# Patient Record
Sex: Female | Born: 1990
Health system: Southern US, Community
[De-identification: ages and names within clinical notes are randomized; demographics above are authoritative.]

## PROBLEM LIST (undated history)

## (undated) DIAGNOSIS — N760 Acute vaginitis: Secondary | ICD-10-CM

## (undated) DIAGNOSIS — T7840XA Allergy, unspecified, initial encounter: Secondary | ICD-10-CM

## (undated) DIAGNOSIS — N946 Dysmenorrhea, unspecified: Secondary | ICD-10-CM

## (undated) DIAGNOSIS — B3731 Acute candidiasis of vulva and vagina: Secondary | ICD-10-CM

## (undated) DIAGNOSIS — B373 Candidiasis of vulva and vagina: Secondary | ICD-10-CM

## (undated) DIAGNOSIS — M419 Scoliosis, unspecified: Secondary | ICD-10-CM

## (undated) DIAGNOSIS — B9689 Other specified bacterial agents as the cause of diseases classified elsewhere: Secondary | ICD-10-CM

## (undated) DIAGNOSIS — E785 Hyperlipidemia, unspecified: Secondary | ICD-10-CM

## (undated) DIAGNOSIS — M549 Dorsalgia, unspecified: Secondary | ICD-10-CM

## (undated) HISTORY — DX: Allergy, unspecified, initial encounter: T78.40XA

## (undated) HISTORY — DX: Dysmenorrhea, unspecified: N94.6

## (undated) HISTORY — DX: Hyperlipidemia, unspecified: E78.5

## (undated) HISTORY — DX: Dorsalgia, unspecified: M54.9

## (undated) HISTORY — PX: WISDOM TOOTH EXTRACTION: SHX21

---
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
## 2009-08-27 ENCOUNTER — Encounter (INDEPENDENT_AMBULATORY_CARE_PROVIDER_SITE_OTHER): Payer: Self-pay | Admitting: Family Medicine

## 2009-08-27 ENCOUNTER — Ambulatory Visit: Payer: Self-pay | Admitting: Internal Medicine

## 2009-08-27 LAB — CONVERTED CEMR LAB
ALT: 8 units/L (ref 0–35)
AST: 19 units/L (ref 0–37)
CO2: 24 meq/L (ref 19–32)
Calcium: 9.7 mg/dL (ref 8.4–10.5)
Chloride: 105 meq/L (ref 96–112)
Lymphs Abs: 2.2 10*3/uL (ref 0.7–4.0)
Monocytes Relative: 7 % (ref 3–12)
Neutro Abs: 4.3 10*3/uL (ref 1.7–7.7)
Neutrophils Relative %: 61 % (ref 43–77)
Platelets: 226 10*3/uL (ref 150–400)
RBC: 4.3 M/uL (ref 3.87–5.11)
Sodium: 141 meq/L (ref 135–145)
Total Bilirubin: 0.5 mg/dL (ref 0.3–1.2)
Total Protein: 7.8 g/dL (ref 6.0–8.3)
Vit D, 25-Hydroxy: <10 ng/mL — ABNORMAL LOW (ref 30–89)
WBC: 7.1 10*3/uL (ref 4.0–10.5)

## 2009-10-07 ENCOUNTER — Ambulatory Visit: Payer: Self-pay | Admitting: Internal Medicine

## 2009-10-07 ENCOUNTER — Encounter (INDEPENDENT_AMBULATORY_CARE_PROVIDER_SITE_OTHER): Payer: Self-pay | Admitting: Family Medicine

## 2009-10-07 LAB — CONVERTED CEMR LAB: Chlamydia, DNA Probe: NEGATIVE

## 2010-08-17 ENCOUNTER — Other Ambulatory Visit (HOSPITAL_COMMUNITY): Payer: Self-pay | Admitting: Family Medicine

## 2010-08-17 DIAGNOSIS — N946 Dysmenorrhea, unspecified: Secondary | ICD-10-CM

## 2010-08-19 ENCOUNTER — Ambulatory Visit (HOSPITAL_COMMUNITY)
Admission: RE | Admit: 2010-08-19 | Discharge: 2010-08-19 | Disposition: A | Discharge status: Home / Self Care | Payer: Self-pay | Source: Ambulatory Visit | Attending: Family Medicine | Admitting: Family Medicine

## 2010-08-19 DIAGNOSIS — N946 Dysmenorrhea, unspecified: Secondary | ICD-10-CM

## 2010-10-28 ENCOUNTER — Encounter: Payer: Self-pay | Admitting: Obstetrics and Gynecology

## 2011-02-15 LAB — RAPID STREP SCREEN (MED CTR MEBANE ONLY): Streptococcus, Group A Screen (Direct): NEGATIVE

## 2011-08-26 ENCOUNTER — Emergency Department (INDEPENDENT_AMBULATORY_CARE_PROVIDER_SITE_OTHER)
Admission: EM | Admit: 2011-08-26 | Discharge: 2011-08-26 | Disposition: A | Discharge status: Home / Self Care | Payer: Self-pay | Source: Home / Self Care | Attending: Emergency Medicine | Admitting: Emergency Medicine

## 2011-08-26 ENCOUNTER — Encounter (HOSPITAL_COMMUNITY): Payer: Self-pay | Admitting: Emergency Medicine

## 2011-08-26 DIAGNOSIS — N760 Acute vaginitis: Secondary | ICD-10-CM

## 2011-08-26 HISTORY — DX: Acute vaginitis: N76.0

## 2011-08-26 HISTORY — DX: Candidiasis of vulva and vagina: B37.3

## 2011-08-26 HISTORY — DX: Acute candidiasis of vulva and vagina: B37.31

## 2011-08-26 HISTORY — DX: Other specified bacterial agents as the cause of diseases classified elsewhere: B96.89

## 2011-08-26 LAB — POCT PREGNANCY, URINE: Preg Test, Ur: NEGATIVE

## 2011-08-26 LAB — POCT URINALYSIS DIP (DEVICE)
Glucose, UA: NEGATIVE mg/dL
Ketones, ur: NEGATIVE mg/dL
Protein, ur: NEGATIVE mg/dL

## 2011-08-26 LAB — WET PREP, GENITAL

## 2011-08-26 MED ORDER — LIDOCAINE HCL (PF) 1 % IJ SOLN
INTRAMUSCULAR | Status: AC
  Filled 2011-08-26: qty 5

## 2011-08-26 MED ORDER — CEFTRIAXONE SODIUM 250 MG IJ SOLR
INTRAMUSCULAR | Status: AC
  Filled 2011-08-26: qty 250

## 2011-08-26 MED ORDER — CEFTRIAXONE SODIUM 250 MG IJ SOLR
250.0000 mg | Freq: Once | INTRAMUSCULAR | Status: AC
  Administered 2011-08-26: 250 mg via INTRAMUSCULAR

## 2011-08-26 MED ORDER — METRONIDAZOLE 500 MG PO TABS: 500.0000 mg | ORAL_TABLET | Freq: Two times a day (BID) | ORAL | Status: AC

## 2011-08-26 MED ORDER — AZITHROMYCIN 250 MG PO TABS
ORAL_TABLET | ORAL | Status: AC
  Filled 2011-08-26: qty 4

## 2011-08-26 MED ORDER — AZITHROMYCIN 250 MG PO TABS
1000.0000 mg | ORAL_TABLET | Freq: Once | ORAL | Status: AC
  Administered 2011-08-26: 1000 mg via ORAL

## 2011-08-26 NOTE — Discharge Instructions (Signed)
Take the medication as written. Give [place] a working phone number so that we can contact you if needed. Refrain from sexual contact until you know your results and your partner(s) are treated. Return if you get worse, have a fever >100.4, or for any concerns.   Go to www.goodrx.com to look up your medications. This will give you a list of where you can find your prescriptions at the most affordable prices.   Go to www.goodrx.com to look up your medications. This will give you a list of where you can find your prescriptions at the most affordable prices.   RESOURCE GUIDE  Chronic Pain Problems Contact [facility]  [phone] Patients need to be referred by their primary care doctor.  Insufficient Money for Medicine Contact [employer]:  call "211" or [facility] [phone].  No Primary Care Doctor Call [facility]  [phone] Other agencies that provide inexpensive medical care    [facility]  [phone]    [facility]  [phone]    [facility]  [phone]    [facility]  [phone] [address] [place] [address]    [facility]  [phone]    [facility]  [phone] [facility] [phone]   [address] [place] [address] [place], [place] [address]  [facility] Resources  [facility]     [employer]                          [facility]. [address] [place]                       [address]      [address] [place] [address]   [phone] (After Hours)

## 2011-08-26 NOTE — ED Provider Notes (Signed)
History     CSN: [id number]  Arrival date & time 08/26/11  1129   First MD Initiated Contact with Patient 08/26/11 1131      Chief Complaint  Patient presents with  . Vaginitis    (Consider location/radiation/quality/duration/timing/severity/associated sxs/prior treatment) HPI Comments: Pt with 3 days oderous brownish vaginal discharge,  No urgency, frequency, dysuria, oderous urine, hematuria,  genital blisters, vaginal itching. No fevers, N/V, abd pain, back pain. No recent abx use. Pt sexually active with a female partner who is  asxatic. does not use condoms. STD's  a concern today. Similar sx before when had BV. States she always gets BV which she has intercourse with her partner. Denies any other sexual contacts. Also has history of yeast infections. No history of gonorrhea chlamydia trichmonoas.  No h/o syphilis, herpes, HIV.  ROS as noted in HPI. All other ROS negative.   Patient is a 21 y.o. female presenting with vaginal discharge.  Vaginal Discharge This is a new problem. The current episode started more than 2 days ago. The problem occurs constantly. The problem has not changed since onset.Pertinent negatives include no abdominal pain. The symptoms are aggravated by nothing. The symptoms are relieved by nothing. She has tried nothing for the symptoms. The treatment provided no relief.    Past Medical History  Diagnosis Date  . BV (bacterial vaginosis)   . Yeast vaginitis     History reviewed. No pertinent past surgical history.  History reviewed. No pertinent family history.  History  Substance Use Topics  . Smoking status: Never Smoker   . Smokeless tobacco: Not on file  . Alcohol Use: Yes    OB History    Grav Para Term Preterm Abortions TAB SAB Ect Mult Living                  Review of Systems  Gastrointestinal: Negative for abdominal pain.  Genitourinary: Positive for vaginal discharge.    Allergies  Review of patient's allergies indicates no known  allergies.  Home Medications   Current Outpatient Rx  Name Route Sig Dispense Refill  . METRONIDAZOLE 500 MG PO TABS Oral Take 1 tablet (500 mg total) by mouth 2 (two) times daily. X 7 days 14 tablet 0    BP 128/82  Pulse 78  Temp(Src) 98.7 F (37.1 C) (Oral)  Resp 18  SpO2 100%  LMP 08/10/2011  Physical Exam  Nursing note and vitals reviewed. Constitutional: She is oriented to person, place, and time. She appears well-developed and well-nourished. No distress.  HENT:  Head: Normocephalic and atraumatic.  Eyes: EOM are normal.  Neck: Normal range of motion.  Cardiovascular: Normal rate.   Pulmonary/Chest: Effort normal.  Abdominal: Soft. Bowel sounds are normal. She exhibits no distension. There is no tenderness. There is no rebound and no guarding.  Genitourinary: Uterus normal. Pelvic exam was performed with patient supine. There is no rash on the right labia. There is no rash on the left labia. Uterus is not tender. Cervix exhibits no motion tenderness and no friability. Right adnexum displays no mass, no tenderness and no fullness. Left adnexum displays no mass, no tenderness and no fullness. No erythema, tenderness or bleeding around the vagina. No foreign body around the vagina. Vaginal discharge found.       Thin white oderous  vaginal d/c.Chaperone present during exam  Musculoskeletal: Normal range of motion.  Neurological: She is alert and oriented to person, place, and time.  Skin: Skin is warm and dry.  Psychiatric: She has a normal mood and affect. Her behavior is normal. Judgment and thought content normal.    ED Course  Procedures (including critical care time)  Labs Reviewed  POCT URINALYSIS DIP (DEVICE) - Abnormal; Notable for the following:    Leukocytes, UA TRACE (*) Biochemical Testing Only. Please order routine urinalysis from main lab if confirmatory testing is needed.   All other components within normal limits  WET PREP, GENITAL - Abnormal; Notable for  the following:    Yeast Wet Prep HPF POC FEW (*)    Clue Cells Wet Prep HPF POC MANY (*)    WBC, Wet Prep HPF POC MODERATE (*)    All other components within normal limits  POCT PREGNANCY, URINE  GC/CHLAMYDIA PROBE AMP, GENITAL   No results found.   1. Vaginitis       MDM  Previous records reviewed Patient tested negative for gonorrhea Chlamydia 2011. Pelvic ultrasound 2012 was normal  H&P most c/w  BV. Sent off GC/chlamydia, wet prep. Will  treat empirically now per patient request. Giving ceftriaxone 250 mg IM/azithro 1 gm po. Will send home with flagyl. Advised pt to refrain from sexual contact until she he knows lab results, symptoms resolve, and partner(s) are treated. Pt provided working phone number. Pt agrees.     Luiz Blare, MD 08/26/11 1256

## 2011-08-26 NOTE — ED Notes (Signed)
PT HERE WITH C/O BROWNISH VAG D/C WITH ODOR,ITCH AFTER HAVING UNPROTECTED SEX LAST WEEK.PT STATES SX SHOWED UP X 3 DYS AGO.DENIES FEVER,CHILLS,N,V.LMP 08/10/11

## 2011-08-27 NOTE — ED Notes (Signed)
Wet Prep:  Many Clue Cells and Moderate WBCs.  Pt adequately treated with Flaqyl.

## 2011-08-31 ENCOUNTER — Ambulatory Visit (INDEPENDENT_AMBULATORY_CARE_PROVIDER_SITE_OTHER): Payer: 59 | Admitting: Family Medicine

## 2011-08-31 ENCOUNTER — Encounter: Payer: Self-pay | Admitting: Family Medicine

## 2011-08-31 VITALS — BP 131/79 | HR 82 | Ht 66.25 in | Wt 146.0 lb

## 2011-08-31 DIAGNOSIS — B3731 Acute candidiasis of vulva and vagina: Secondary | ICD-10-CM

## 2011-08-31 DIAGNOSIS — N946 Dysmenorrhea, unspecified: Secondary | ICD-10-CM

## 2011-08-31 DIAGNOSIS — B373 Candidiasis of vulva and vagina: Secondary | ICD-10-CM

## 2011-08-31 DIAGNOSIS — N944 Primary dysmenorrhea: Secondary | ICD-10-CM

## 2011-08-31 DIAGNOSIS — Z13220 Encounter for screening for lipoid disorders: Secondary | ICD-10-CM

## 2011-08-31 MED ORDER — FLUCONAZOLE 150 MG PO TABS: 150.0000 mg | ORAL_TABLET | Freq: Once | ORAL | Status: AC

## 2011-08-31 MED ORDER — NORGESTIMATE-ETH ESTRADIOL 0.25-35 MG-MCG PO TABS: 1.0000 | ORAL_TABLET | Freq: Every day | ORAL | Status: DC

## 2011-08-31 NOTE — Patient Instructions (Signed)
I sent in a prescription for both the birth control pill and the fluconazole for the yeast infection.   I would also like to get some lab work to check your cholesterol. You can come back for a lab visit. Please be fasting after midnight that day.   I would like to see you back after your next period to see if the birth control helped.

## 2011-09-01 ENCOUNTER — Encounter: Payer: Self-pay | Admitting: Family Medicine

## 2011-09-01 DIAGNOSIS — N944 Primary dysmenorrhea: Secondary | ICD-10-CM | POA: Insufficient documentation

## 2011-09-01 DIAGNOSIS — B373 Candidiasis of vulva and vagina: Secondary | ICD-10-CM | POA: Insufficient documentation

## 2011-09-01 DIAGNOSIS — B3731 Acute candidiasis of vulva and vagina: Secondary | ICD-10-CM | POA: Insufficient documentation

## 2011-09-01 NOTE — Assessment & Plan Note (Signed)
Ongoing problem since adolescence. Patient had [place] in 2011 that did not show any acute abnormality.  Since patient had a pelvic exam at Urgent Care a couple of days ago, I did not perform one for this visit.  Patient agreable to try OCP again. She mentioned that they had helped when she had taken them but that she was trouble remembering to take them. She is now in a more stable place in her life and thinks she will be able to take them regularly. Also recommended that she take ibuprofen before onset of period, although she had tried doing this before and had not seen much benefit.  Start monophasic sprintec.  Will follow up in 1-2 months, after patient has next period to assess for any improvement. Patient will also need paperwork in June for [facility].

## 2011-09-01 NOTE — Progress Notes (Signed)
Patient ID: Amy Barrett, female   DOB: [birth year], 21 y.o.   MRN: [id number]  Chief Complaint  Patient presents with  . Dysmenorrhea    HPI Amy Barrett is a 21 y.o. female who presents to establish care and to be seen for dysmenorrhea and to get process started for [facility]. - Dysmenorrhea: has been present since adolescence. She has been seen by gyn in 2011 and had an ultrasounds that was normal. She was told that it could potentially be endometriosis and that the only way to know would be laparoscopy. Symptoms include cramping pain, associated with nausea and inability to take much by mouth. This starts on the first day of period and lasts one day. The following day she is tired but doesn't experience pain. Cramps on that day are debilitating to the point of her missing work, which is why she would like to apply for FMLA. She has been on ibuprofen 800mg  before but has difficulty with it because she cannot eat when the cramping occurs and she is afraid to hurt her stomach. She was given vicodin which did not help. She has been on OCP before but had trouble remembering to take them. She mentions that she would be willing to try now since her schedule is now a little more predictable than it was in the past.  Denies vaginal discharge, odor, burning. Denies constipation or diarrhea. Denies dysuria or hematuria.   - follow up from urgent care on 08/26/11: had wet prep that showed BV and fluconazole. Rx for flagyl was sent. Patient has not yet filled it. Negative GC/Chl.  Denies any current vaginal discharge, burning.   Gyn History: Menarche: 21 yo Regular periods, every 21-28 days, lasts 5 days. Dysmenorrhea on day 1. Has had dysmenorrhea since middle school.  HPI  Past Medical History  Diagnosis Date  . BV (bacterial vaginosis)   . Yeast vaginitis   . Dysmenorrhea since adolescence    [place] in 2011 that did not show any acute findings. Seen by Dr. Vickey Sages    History reviewed. No pertinent past  surgical history.  Family History  Problem Relation Age of Onset  . Hypertension Mother   . Breast cancer Maternal Grandmother 50    Social History History  Substance Use Topics  . Smoking status: Never Smoker   . Smokeless tobacco: Not on file  . Alcohol Use: Yes     occasional, social    No Known Allergies  Current Outpatient Prescriptions  Medication Sig Dispense Refill  . fluconazole (DIFLUCAN) 150 MG tablet Take 1 tablet (150 mg total) by mouth once.  1 tablet  0  . metroNIDAZOLE (FLAGYL) 500 MG tablet Take 1 tablet (500 mg total) by mouth 2 (two) times daily. X 7 days  14 tablet  0  . norgestimate-ethinyl estradiol (ORTHO-CYCLEN,SPRINTEC,PREVIFEM) 0.25-35 MG-MCG tablet Take 1 tablet by mouth daily.  1 Package  11     Review of Systems See HPI Blood pressure 131/79, pulse 82, height 5' 6.25" (1.683 m), weight 146 lb (66.225 kg), last menstrual period 08/10/2011.  Physical Exam Physical Exam  Constitutional: She is oriented to person, place, and time. She appears well-developed. No distress.  HENT:  Mouth/Throat: Oropharynx is clear and moist.  Eyes: Conjunctivae and EOM are normal. Pupils are equal, round, and reactive to light.  Neck: Normal range of motion. Neck supple. No thyromegaly present.  Cardiovascular: Normal rate, regular rhythm and normal heart sounds.   No murmur heard. Pulmonary/Chest: Effort normal and breath sounds  normal.  Abdominal: Soft. Bowel sounds are normal. She exhibits no distension. There is no tenderness. There is no rebound and no guarding.  Musculoskeletal: Normal range of motion. She exhibits no edema.  Neurological: She is alert and oriented to person, place, and time.  Skin: Skin is warm and dry.    Assessment    See problem list    Plan    See problem list       Marena Chancy 09/01/2011, 5:18 PM

## 2011-09-01 NOTE — Assessment & Plan Note (Signed)
Sent Rx for fluconazole 150mg  po once

## 2011-10-14 ENCOUNTER — Other Ambulatory Visit (HOSPITAL_COMMUNITY)
Admission: RE | Admit: 2011-10-14 | Discharge: 2011-10-14 | Disposition: A | Discharge status: Home / Self Care | Payer: 59 | Source: Ambulatory Visit | Attending: Family Medicine | Admitting: Family Medicine

## 2011-10-14 ENCOUNTER — Telehealth: Payer: Self-pay | Admitting: Family Medicine

## 2011-10-14 ENCOUNTER — Ambulatory Visit (INDEPENDENT_AMBULATORY_CARE_PROVIDER_SITE_OTHER): Payer: 59 | Admitting: Family Medicine

## 2011-10-14 ENCOUNTER — Encounter: Payer: Self-pay | Admitting: Family Medicine

## 2011-10-14 VITALS — BP 109/73 | HR 92 | Temp 98.5°F | Ht 66.25 in | Wt 144.0 lb

## 2011-10-14 DIAGNOSIS — Z113 Encounter for screening for infections with a predominantly sexual mode of transmission: Secondary | ICD-10-CM | POA: Insufficient documentation

## 2011-10-14 DIAGNOSIS — N898 Other specified noninflammatory disorders of vagina: Secondary | ICD-10-CM

## 2011-10-14 DIAGNOSIS — J039 Acute tonsillitis, unspecified: Secondary | ICD-10-CM | POA: Insufficient documentation

## 2011-10-14 DIAGNOSIS — Z7251 High risk heterosexual behavior: Secondary | ICD-10-CM | POA: Insufficient documentation

## 2011-10-14 DIAGNOSIS — J029 Acute pharyngitis, unspecified: Secondary | ICD-10-CM

## 2011-10-14 DIAGNOSIS — N760 Acute vaginitis: Secondary | ICD-10-CM | POA: Insufficient documentation

## 2011-10-14 LAB — POCT WET PREP (WET MOUNT)

## 2011-10-14 LAB — POCT RAPID STREP A (OFFICE): Rapid Strep A Screen: NEGATIVE

## 2011-10-14 MED ORDER — PENICILLIN G BENZATHINE 1200000 UNIT/2ML IM SUSP
1.2000 10*6.[IU] | Freq: Once | INTRAMUSCULAR | Status: AC
  Administered 2011-10-14: 1.2 10*6.[IU] via INTRAMUSCULAR

## 2011-10-14 MED ORDER — METRONIDAZOLE 500 MG PO TABS: 500.0000 mg | ORAL_TABLET | Freq: Two times a day (BID) | ORAL | Status: AC

## 2011-10-14 NOTE — Assessment & Plan Note (Addendum)
A: exam consistent with tonsillitis with bilateral exudate. Possible strep pharyngitis but negative swab. Patient has modified Centor score of 3.  P: treat with IM bicillin.

## 2011-10-14 NOTE — Progress Notes (Addendum)
Subjective:     Patient ID: Prudencio Burly, female   DOB: 01/18/1991, 21 y.o.   MRN: [id number]  HPI 21 yo F presents for same day visit to discuss the following:  1. Sore throat x 2 days. No cough, fever, chest pain, headache or sick contacts. Has not treated the sore throat.   2. Vaginal discharge x 1 week. Sexually active with ex-boyfriend. Does not use condoms or OCPs. LMP 10/02/11. Ex-BF has other sexual partners. Discharge is malodorous. She denies abdominal or pelvic pain.   Reviewed history: no cigarette or drug use.   Review of Systems As per HPI    Objective:   Physical Exam  Constitutional: She appears well-developed and well-nourished. No distress.  HENT:  Mouth/Throat: Uvula is midline and mucous membranes are normal. Oropharyngeal exudate present. No posterior oropharyngeal edema, posterior oropharyngeal erythema or tonsillar abscesses.    Neck:    Genitourinary: Uterus normal. Pelvic exam was performed with patient prone. Cervix exhibits discharge. Cervix exhibits no motion tenderness and no friability. Right adnexum displays no mass, no tenderness and no fullness. Left adnexum displays no mass, no tenderness and no fullness. No erythema, tenderness or bleeding around the vagina. No foreign body around the vagina. No signs of injury around the vagina. Vaginal discharge found.   Assessment and Plan:

## 2011-10-14 NOTE — Telephone Encounter (Signed)
Called pt. Left VM. Wet prep positive for clue cell (BV) will treat with flagyl. 1 tab BID x 7 days.

## 2011-10-14 NOTE — Assessment & Plan Note (Signed)
A: suspect BV given odor and consistency of discharge. P: -wet prep -GC/Chlam.

## 2011-10-14 NOTE — Patient Instructions (Addendum)
Amy Barrett,  Thank you for coming in today.  For your throat: we will treat with a shot of PCN. Take tylenol, use lozenges and gargle with warm salt water as needed for pain. Call if you develop, neck swelling or worsening pain.   For your discharge: most likely BV, I will also check for gonorrhea, chlamydia and HIV.  Dr. Armen Pickup

## 2011-10-14 NOTE — Assessment & Plan Note (Signed)
Couseled patient on safe sex. Gave condoms. Will screen for HIV.

## 2011-10-18 ENCOUNTER — Telehealth: Payer: Self-pay | Admitting: Family Medicine

## 2011-10-18 NOTE — Telephone Encounter (Signed)
Called patient at home. Informed her that GC/Chlam were negative. Her wet prep was positive for clue cells only.

## 2011-10-25 ENCOUNTER — Telehealth: Payer: Self-pay | Admitting: Family Medicine

## 2011-10-25 NOTE — Telephone Encounter (Signed)
Patient is calling to check on the status of her FMLA paperwork.  She needs to turn it in by 6/28. Also, she has finished the meds for her bacterial infection but is still having symptoms and wants to know if she needs to take something else.

## 2011-10-25 NOTE — Telephone Encounter (Signed)
Called and left message letting patient know that form will be available to pick up at front desk tomorrow afternoon.

## 2011-10-26 ENCOUNTER — Telehealth: Payer: Self-pay | Admitting: Family Medicine

## 2011-10-26 NOTE — Telephone Encounter (Signed)
Called and left message. Let patient know that if the flagyl treatment did not work for her BV infection, we would probably need to test her again to make sure that BV is still present after treatment. If it is, would start metrogel for 7-10 days with maintenance treatment twice weekly for 4-6 months, for recurrent infection. Recommended that she make appointment to get test for confirmation of failure of treatment.

## 2011-10-26 NOTE — Telephone Encounter (Signed)
Called patient back.  No answer.  Will call patient back later today.  Gaylene Brooks, RN

## 2011-10-26 NOTE — Telephone Encounter (Signed)
Is needing an answer for the 2nd part of her message.

## 2011-10-27 NOTE — Telephone Encounter (Signed)
Called patient and no answer.  Will call patient back later today.  Gaylene Brooks, RN

## 2011-10-28 NOTE — Telephone Encounter (Signed)
Called patient and no answer.  Will await call back from patient.  Gaylene Brooks, RN

## 2011-10-29 ENCOUNTER — Ambulatory Visit (INDEPENDENT_AMBULATORY_CARE_PROVIDER_SITE_OTHER): Payer: 59 | Admitting: Family Medicine

## 2011-10-29 ENCOUNTER — Encounter: Payer: Self-pay | Admitting: Family Medicine

## 2011-10-29 VITALS — BP 106/60 | HR 76 | Temp 98.6°F | Ht 66.25 in | Wt 139.2 lb

## 2011-10-29 DIAGNOSIS — N760 Acute vaginitis: Secondary | ICD-10-CM

## 2011-10-29 LAB — POCT WET PREP (WET MOUNT): Clue Cells Wet Prep Whiff POC: NEGATIVE

## 2011-10-29 MED ORDER — FLUCONAZOLE 150 MG PO TABS: 150.0000 mg | ORAL_TABLET | Freq: Once | ORAL | Status: AC

## 2011-10-29 NOTE — Progress Notes (Signed)
  Subjective:    Patient ID: Amy Barrett, female    DOB: [birth year], 21 y.o.   MRN: [id number]  HPI Follow-up: vaginal discharge  She was seen on 06/13 and diagnosed with BV. She reports the smell has gone away but it remains persistently irritated. GC/Chlamydia negative at that time. She finished a course of metronidazole. She is about due for her next period. She denies intercourse for the past month (since onset of symptoms).   Review of Systems Per HPI.  Denies dysuria/frequency/urgency.     Objective:   Physical Exam GEN: NAD; well-nourished, well-appearing PSYCH: engaged, appropriate, normal affect, alert and oriented GU:    Vulva: normal without lesions or tenderness   Vagina: thick white vaginal discharge mainly in posterior vault   Cervix: pink, non-friable, normal-appearing  Wet prep performed    Assessment & Plan:

## 2011-10-29 NOTE — Patient Instructions (Addendum)
-  I will call you with the results

## 2011-10-29 NOTE — Assessment & Plan Note (Addendum)
Will repeat wet prep today.>>>No yeast but lots of white cells. Discharge consistent with yeast and patient just finished antibiotic course of BV. Will send in Rx for Diflucan. Called and informed pt.

## 2011-12-28 ENCOUNTER — Ambulatory Visit (INDEPENDENT_AMBULATORY_CARE_PROVIDER_SITE_OTHER): Payer: 59 | Admitting: Family Medicine

## 2011-12-28 ENCOUNTER — Encounter: Payer: Self-pay | Admitting: Family Medicine

## 2011-12-28 VITALS — BP 118/77 | HR 58 | Temp 98.6°F | Ht 66.0 in | Wt 140.0 lb

## 2011-12-28 DIAGNOSIS — N760 Acute vaginitis: Secondary | ICD-10-CM

## 2011-12-28 DIAGNOSIS — N898 Other specified noninflammatory disorders of vagina: Secondary | ICD-10-CM

## 2011-12-28 DIAGNOSIS — Z7251 High risk heterosexual behavior: Secondary | ICD-10-CM

## 2011-12-28 LAB — POCT WET PREP (WET MOUNT)

## 2011-12-28 NOTE — Assessment & Plan Note (Addendum)
A: negative wet prep. Suspect skin irritation.  P:  Called patient with results. Negative wet prep.  Advised sitz baths. Advised f/u as needed for persistent or new GU symptoms.

## 2011-12-28 NOTE — Progress Notes (Signed)
Patient ID: Amy Barrett, female   DOB: 04-13-1991, 21 y.o.   MRN: [id number] Subjective:     Amy Barrett is a 21 y.o. female who presents for evaluation of an abnormal vaginal discharge. Symptoms have been present for 2 weeks. Vaginal symptoms: vulvar itching. Contraception: none. She denies abnormal bleeding, blisters, bumps, odor, pain and warts Sexually transmitted infection risk: possible STD exposure in the past. Not sexually active now.  Menstrual flow: regular every 28-30 days.  The following portions of the patient's history were reviewed and updated as appropriate: allergies, current medications, past family history, past medical history, past social history, past surgical history and problem list.  Review of Systems Pertinent items are noted in HPI.    Objective:    BP 118/77  Pulse 58  Temp 98.6 F (37 C) (Oral)  Ht 5\' 6"  (1.676 m)  Wt 140 lb (63.504 kg)  BMI 22.60 kg/m2 General appearance: alert, cooperative and no distress Abdomen: soft, non-tender; bowel sounds normal; no masses,  no organomegaly Pelvic: cervix normal in appearance, external genitalia normal, no adnexal masses or tenderness, no cervical motion tenderness and positive findings: vaginal discharge:  copious, white, thick and odorless    Assessment and Plan:

## 2011-12-28 NOTE — Assessment & Plan Note (Signed)
History of unprotected sex. Last HIV test negative in 2008. Will recheck HIV today. No signs or symptoms to suggest HSV, HPV or syphilis.

## 2011-12-28 NOTE — Patient Instructions (Addendum)
Ahlaya,  Thank you for coming in today. I will call with wet prep and HIV test results.   [facility] Phone:  [phone] Location: [address] Hours: 7:30 a.m. to 7:30 p.m., Monday - Friday  Dr. Armen Pickup

## 2011-12-29 LAB — HIV ANTIBODY (ROUTINE TESTING W REFLEX): HIV: NONREACTIVE

## 2011-12-30 ENCOUNTER — Encounter: Payer: Self-pay | Admitting: *Deleted

## 2012-01-12 ENCOUNTER — Telehealth: Payer: Self-pay | Admitting: Family Medicine

## 2012-01-12 ENCOUNTER — Encounter: Payer: Self-pay | Admitting: Family Medicine

## 2012-01-12 ENCOUNTER — Ambulatory Visit (INDEPENDENT_AMBULATORY_CARE_PROVIDER_SITE_OTHER): Payer: 59 | Admitting: Family Medicine

## 2012-01-12 ENCOUNTER — Other Ambulatory Visit (HOSPITAL_COMMUNITY)
Admission: RE | Admit: 2012-01-12 | Discharge: 2012-01-12 | Disposition: A | Discharge status: Home / Self Care | Payer: 59 | Source: Ambulatory Visit | Attending: Family Medicine | Admitting: Family Medicine

## 2012-01-12 VITALS — BP 110/75 | HR 65 | Ht 66.25 in | Wt 138.0 lb

## 2012-01-12 DIAGNOSIS — B9689 Other specified bacterial agents as the cause of diseases classified elsewhere: Secondary | ICD-10-CM | POA: Insufficient documentation

## 2012-01-12 DIAGNOSIS — N760 Acute vaginitis: Secondary | ICD-10-CM

## 2012-01-12 DIAGNOSIS — A499 Bacterial infection, unspecified: Secondary | ICD-10-CM

## 2012-01-12 DIAGNOSIS — N898 Other specified noninflammatory disorders of vagina: Secondary | ICD-10-CM

## 2012-01-12 DIAGNOSIS — Z113 Encounter for screening for infections with a predominantly sexual mode of transmission: Secondary | ICD-10-CM | POA: Insufficient documentation

## 2012-01-12 LAB — POCT WET PREP (WET MOUNT): WBC, Wet Prep HPF POC: >20

## 2012-01-12 MED ORDER — FLUCONAZOLE 150 MG PO TABS: 150.0000 mg | ORAL_TABLET | Freq: Once | ORAL | Status: AC

## 2012-01-12 MED ORDER — METRONIDAZOLE 500 MG PO TABS: 500.0000 mg | ORAL_TABLET | Freq: Two times a day (BID) | ORAL | Status: DC

## 2012-01-12 NOTE — Progress Notes (Signed)
  Subjective:    Patient ID: Amy Barrett, female    DOB: 09/09/1990, 21 y.o.   MRN: [id number]  HPI  72 year F who presents with vaginal odor and discharge. This problem started 2 weeks ago, and the patient was evaluated by Dr. Armen Pickup. At that time, the patient was only experiencing vaginal discharge. The wet prep did not demonstrate any clue cells or evidence of trichomoniasis or candidiasis. She was last sexually active 2 weeks ago. It is a part of the she's had multiple times, and she often gets bacterial vaginosis after intercourse with him. She had unprotected sex. She denies a history of gonorrhea or Chlamydia and recently tested negative for HIV.  Review of Systems  She currently denies any pelvic pain dysuria, dyspareunia, vaginal bleeding, fevers, chills, nausea and vomiting, or lymphadenopathy.     Objective:   Physical Exam  Constitutional: She appears well-developed and well-nourished. No distress.  Cardiovascular: Normal rate and regular rhythm.   Pulmonary/Chest: Effort normal and breath sounds normal.  Abdominal: Soft. Bowel sounds are normal. There is tenderness. There is no guarding. Hernia confirmed negative in the right inguinal area and confirmed negative in the left inguinal area.  Genitourinary: Uterus normal. Pelvic exam was performed with patient prone. There is no rash, tenderness or lesion on the right labia. There is no rash, tenderness or lesion on the left labia. Cervix exhibits no motion tenderness, no discharge and no friability. Right adnexum displays no mass and no tenderness. Left adnexum displays no mass and no tenderness. No tenderness or bleeding around the vagina. No signs of injury around the vagina. Vaginal discharge found.  Lymphadenopathy:       Right: No inguinal adenopathy present.       Left: No inguinal adenopathy present.  Skin: She is not diaphoretic.   BP 110/75  Pulse 65  Ht 5' 6.25" (1.683 m)  Wt 138 lb (62.596 kg)  BMI 22.11  kg/m2        Assessment & Plan:  21 year old female with vaginal discharge that is malodorous and from protected sex 2 weeks ago but does not have any evidence of PID.

## 2012-01-12 NOTE — Assessment & Plan Note (Signed)
Treat with metronidazole 500 mg by mouth twice a day for 7 days. The patient was also given Diflucan 150 mg by mouth once for a yeast infection.

## 2012-01-12 NOTE — Assessment & Plan Note (Signed)
Likely BV. Follow up the results and treat patient accordingly. Encouraged to use contraception when having sex.

## 2012-01-12 NOTE — Telephone Encounter (Signed)
Patient notified of positive wet prep for bacterial vaginosis. Treatment sent to pharmacy.

## 2012-01-13 ENCOUNTER — Encounter: Payer: Self-pay | Admitting: Family Medicine

## 2012-01-13 ENCOUNTER — Telehealth: Payer: Self-pay | Admitting: Family Medicine

## 2012-01-13 NOTE — Telephone Encounter (Signed)
Left message alerting the patient that her STD test was negative. She was told to call the office if she has any questions.

## 2012-02-02 ENCOUNTER — Ambulatory Visit (INDEPENDENT_AMBULATORY_CARE_PROVIDER_SITE_OTHER): Payer: 59 | Admitting: Family Medicine

## 2012-02-02 ENCOUNTER — Encounter: Payer: Self-pay | Admitting: Family Medicine

## 2012-02-02 VITALS — BP 116/72 | HR 67 | Ht 66.5 in | Wt 143.0 lb

## 2012-02-02 DIAGNOSIS — N760 Acute vaginitis: Secondary | ICD-10-CM

## 2012-02-02 DIAGNOSIS — A499 Bacterial infection, unspecified: Secondary | ICD-10-CM

## 2012-02-02 DIAGNOSIS — N898 Other specified noninflammatory disorders of vagina: Secondary | ICD-10-CM | POA: Insufficient documentation

## 2012-02-02 DIAGNOSIS — B9689 Other specified bacterial agents as the cause of diseases classified elsewhere: Secondary | ICD-10-CM

## 2012-02-02 MED ORDER — METRONIDAZOLE 500 MG PO TABS: 500.0000 mg | ORAL_TABLET | Freq: Two times a day (BID) | ORAL | Status: DC

## 2012-02-02 NOTE — Assessment & Plan Note (Addendum)
I looked at wet prep slide under the microscope + whiff, + clue cells, KOB prep neg for yeast. Will treat for BV with po flagyl. Is not concerned about STIs and declines testing.

## 2012-02-02 NOTE — Patient Instructions (Signed)
It was nice to meet you.  It looks like you have BV (bacterial vaginosis) again.   I will send in the medication again.  Follow up with your regular doctor (Dr. Gwenlyn Saran) if this continues to be an issue.

## 2012-02-02 NOTE — Progress Notes (Signed)
S: Pt comes in today for vaginal discharge. Was seen 01/12/12 and diagnosed with BV- given flagyl. Discharge resolved entirely but then came back ~3 days ago. + sexually active with 1 partner, does not use condoms.  Has noticed white, thick d/c with chunks. Is not foul smelling yet, but feels like this d/c is the same as when she was diagnosed w/ BV a few weeks ago, she just caught it earlier.  No N/V, no diarrhea, no fevers/chills, no pelvic pain. Is not concerned about STIs and declines testing.   ROS: Per HPI  History  Smoking status  . Never Smoker   Smokeless tobacco  . Not on file    O:  Filed Vitals:   02/02/12 1349  BP: 116/72  Pulse: 67    Gen: NAD Abd: soft, NT Pelvic: thick, white discharge in vagina, + fishy odor, no cervical irritation or erythema; no CMT, uterus and ovaries WNL  Wet prep: + whiff; + clue cells, + cocci, + epis, no yeast seen on KOH prep    A/P: 21 y.o. female p/w BV -See problem list -f/u PRN

## 2012-05-18 ENCOUNTER — Other Ambulatory Visit (HOSPITAL_COMMUNITY)
Admission: RE | Admit: 2012-05-18 | Discharge: 2012-05-18 | Disposition: A | Discharge status: Home / Self Care | Payer: 59 | Source: Ambulatory Visit | Attending: Family Medicine | Admitting: Family Medicine

## 2012-05-18 ENCOUNTER — Ambulatory Visit (INDEPENDENT_AMBULATORY_CARE_PROVIDER_SITE_OTHER): Payer: 59 | Admitting: Family Medicine

## 2012-05-18 ENCOUNTER — Encounter: Payer: Self-pay | Admitting: Family Medicine

## 2012-05-18 VITALS — BP 113/71 | HR 75 | Ht 66.5 in | Wt 147.8 lb

## 2012-05-18 DIAGNOSIS — N760 Acute vaginitis: Secondary | ICD-10-CM

## 2012-05-18 DIAGNOSIS — N898 Other specified noninflammatory disorders of vagina: Secondary | ICD-10-CM

## 2012-05-18 DIAGNOSIS — B9689 Other specified bacterial agents as the cause of diseases classified elsewhere: Secondary | ICD-10-CM

## 2012-05-18 DIAGNOSIS — Z113 Encounter for screening for infections with a predominantly sexual mode of transmission: Secondary | ICD-10-CM | POA: Insufficient documentation

## 2012-05-18 DIAGNOSIS — A499 Bacterial infection, unspecified: Secondary | ICD-10-CM

## 2012-05-18 LAB — POCT WET PREP (WET MOUNT): Clue Cells Wet Prep Whiff POC: POSITIVE

## 2012-05-18 MED ORDER — METRONIDAZOLE 500 MG PO TABS: 500.0000 mg | ORAL_TABLET | Freq: Two times a day (BID) | ORAL | Status: AC

## 2012-05-18 NOTE — Patient Instructions (Addendum)
It was nice to meet you today.  It looks like you have BV again.  I am sending in medicine to treat this.  You should use condoms with this partner and see if that doesn't help prevent you from having the infection.  We will call you if your STD test comes back positive.   Come back to talk with Dr. Gwenlyn Saran about your painful periods.

## 2012-05-18 NOTE — Progress Notes (Signed)
S: Pt comes in today for SDA for vaginal discharge.  Has had discharge since Sunday (x4 days), last intercourse was Saturday and she reports she always gets BV after having sex with this partner.  Unprotected- no condoms or other form of birth control.  D/c is whtie, thick, mostly with wiping. No bleeding or itching but + burning.  No h/o STDs but would like to be checked.     ROS: Per HPI  History  Smoking status  . Never Smoker   Smokeless tobacco  . Not on file    O:  Filed Vitals:   05/18/12 1131  BP: 113/71  Pulse: 75    Gen: NAD Abd: soft, NT Pelvic: normal external genitalia, thin, white d/c in vaginal vault, cervix normal in appearance although some bleeding after GC/Chlam swab, no CMT or pelvic fullness    A/P: 22 y.o. female p/w vaginal d/c -See problem list -f/u in PRN

## 2012-05-18 NOTE — Assessment & Plan Note (Signed)
Wet prep c/w BV, will treat with po flagyl x7 days.

## 2012-06-13 ENCOUNTER — Ambulatory Visit (INDEPENDENT_AMBULATORY_CARE_PROVIDER_SITE_OTHER): Payer: 59 | Admitting: Family Medicine

## 2012-06-13 ENCOUNTER — Encounter: Payer: Self-pay | Admitting: Family Medicine

## 2012-06-13 ENCOUNTER — Other Ambulatory Visit (HOSPITAL_COMMUNITY)
Admission: RE | Admit: 2012-06-13 | Discharge: 2012-06-13 | Disposition: A | Discharge status: Home / Self Care | Payer: 59 | Source: Ambulatory Visit | Attending: Family Medicine | Admitting: Family Medicine

## 2012-06-13 VITALS — BP 111/73 | HR 59 | Temp 98.7°F | Ht 66.5 in | Wt 149.0 lb

## 2012-06-13 DIAGNOSIS — Z113 Encounter for screening for infections with a predominantly sexual mode of transmission: Secondary | ICD-10-CM | POA: Insufficient documentation

## 2012-06-13 DIAGNOSIS — N898 Other specified noninflammatory disorders of vagina: Secondary | ICD-10-CM

## 2012-06-13 DIAGNOSIS — N760 Acute vaginitis: Secondary | ICD-10-CM

## 2012-06-13 LAB — POCT WET PREP (WET MOUNT)
Clue Cells Wet Prep Whiff POC: NEGATIVE
WBC, Wet Prep HPF POC: >20

## 2012-06-13 MED ORDER — METRONIDAZOLE 500 MG PO TABS: ORAL_TABLET | ORAL | Status: DC

## 2012-06-13 MED ORDER — FLUCONAZOLE 150 MG PO TABS: 150.0000 mg | ORAL_TABLET | Freq: Once | ORAL | Status: DC

## 2012-06-13 NOTE — Patient Instructions (Signed)
Please take medications as reviewed.   Bacterial Vaginosis Bacterial vaginosis (BV) is a vaginal infection where the normal balance of bacteria in the vagina is disrupted. The normal balance is then replaced by an overgrowth of certain bacteria. There are several different kinds of bacteria that can cause BV. BV is the most common vaginal infection in women of childbearing age. CAUSES   The cause of BV is not fully understood. BV develops when there is an increase or imbalance of harmful bacteria.  Some activities or behaviors can upset the normal balance of bacteria in the vagina and put women at increased risk including:  Having a new sex partner or multiple sex partners.  Douching.  Using an intrauterine device (IUD) for contraception.  It is not clear what role sexual activity plays in the development of BV. However, women that have never had sexual intercourse are rarely infected with BV. Women do not get BV from toilet seats, bedding, swimming pools or from touching objects around them.  SYMPTOMS   Grey vaginal discharge.  A fish-like odor with discharge, especially after sexual intercourse.  Itching or burning of the vagina and vulva.  Burning or pain with urination.  Some women have no signs or symptoms at all. DIAGNOSIS  Your caregiver must examine the vagina for signs of BV. Your caregiver will perform lab tests and look at the sample of vaginal fluid through a microscope. They will look for bacteria and abnormal cells (clue cells), a pH test higher than 4.5, and a positive amine test all associated with BV.  RISKS AND COMPLICATIONS   Pelvic inflammatory disease (PID).  Infections following gynecology surgery.  Developing HIV.  Developing herpes virus. TREATMENT  Sometimes BV will clear up without treatment. However, all women with symptoms of BV should be treated to avoid complications, especially if gynecology surgery is planned. Female partners generally do not  need to be treated. However, BV may spread between female sex partners so treatment is helpful in preventing a recurrence of BV.   BV may be treated with antibiotics. The antibiotics come in either pill or vaginal cream forms. Either can be used with nonpregnant or pregnant women, but the recommended dosages differ. These antibiotics are not harmful to the baby.  BV can recur after treatment. If this happens, a second round of antibiotics will often be prescribed.  Treatment is important for pregnant women. If not treated, BV can cause a premature delivery, especially for a pregnant woman who had a premature birth in the past. All pregnant women who have symptoms of BV should be checked and treated.  For chronic reoccurrence of BV, treatment with a type of prescribed gel vaginally twice a week is helpful. HOME CARE INSTRUCTIONS   Finish all medication as directed by your caregiver.  Do not have sex until treatment is completed.  Tell your sexual partner that you have a vaginal infection. They should see their caregiver and be treated if they have problems, such as a mild rash or itching.  Practice safe sex. Use condoms. Only have 1 sex partner. PREVENTION  Basic prevention steps can help reduce the risk of upsetting the natural balance of bacteria in the vagina and developing BV:  Do not have sexual intercourse (be abstinent).  Do not douche.  Use all of the medicine prescribed for treatment of BV, even if the signs and symptoms go away.  Tell your sex partner if you have BV. That way, they can be treated, if needed, to  prevent reoccurrence. SEEK MEDICAL CARE IF:   Your symptoms are not improving after 3 days of treatment.  You have increased discharge, pain, or fever. MAKE SURE YOU:   Understand these instructions.  Will watch your condition.  Will get help right away if you are not doing well or get worse. FOR MORE INFORMATION  Division of STD Prevention (DSTDP), Centers  for Disease Control and Prevention: [employer] American Social Health Association (ASHA): www.ashastd.org  Document Released: 04/19/2005 Document Revised: 07/12/2011 Document Reviewed: 10/10/2008 [facility] Patient Information 2013 [place], [place].

## 2012-06-13 NOTE — Assessment & Plan Note (Addendum)
-   Wet prep: positive for BV only; G/C pending.  - Treated with flagyl x7d BID, then additional 21d of QD for resistance.  - patient encouraged to make an appointment with PCP to complete women's health maintenance and have PAP.  - Will have patient follow back with myself or Losq for test of cure 2-4 days after medications completed. If negative at that time will start Metro gel x2 nights a week prior to bedtime, for 4 months. If positive will treat appropiately and restart process for resistant BV. Considered changing therapy to clindamycin, however patient states she gets relief from medications. May reconsider if her test of cure is still positive.

## 2012-06-13 NOTE — Progress Notes (Signed)
Subjective:     Patient ID: Amy Barrett, female   DOB: [birth year], 22 y.o.   MRN: [id number]  HPI  Vaginal discharge: Patient is presented for increased "chunky, white discharge." She reports it had not completely went away from her last visit in January. She admits to intense internal itching. Denies odor, abd. pain or vaginal pain. She has repeated BV infections over the course of the year. She has tested G/C negative on her last visit and HIV negative last year. She has not had intercourse since prior to her last appointment here. Her periods are regular and LMP was the week prior to her appointment in Jan.   Review of Systems  Constitutional: Negative for fever and chills.  Genitourinary: Negative for dysuria.       Objective:   Physical Exam  Constitutional: She appears well-developed and well-nourished. No distress.  Abdominal: Soft. She exhibits no distension and no mass. There is no tenderness. There is no rebound and no guarding.  Psychiatric: She has a normal mood and affect.  GYN:  External genitalia within normal limits. Thin white vaginal discharge noted externally. Vaginal mucosa pink, moist, normal rugae.  Slightly Friable cervix without lesions, moderate white discharge noted. No  bleeding noted on speculum exam.  Bimanual exam revealed normal, nongravid uterus.  No cervical motion tenderness. No adnexal masses bilaterally.     BP 111/73  Pulse 59  Temp(Src) 98.7 F (37.1 C) (Oral)  Ht 5' 6.5" (1.689 m)  Wt 149 lb (67.586 kg)  BMI 23.69 kg/m2  LMP 04/29/2012

## 2012-06-16 ENCOUNTER — Encounter: Payer: Self-pay | Admitting: Family Medicine

## 2012-07-27 ENCOUNTER — Encounter: Payer: Self-pay | Admitting: Family Medicine

## 2012-07-27 ENCOUNTER — Ambulatory Visit (INDEPENDENT_AMBULATORY_CARE_PROVIDER_SITE_OTHER): Payer: 59 | Admitting: Family Medicine

## 2012-07-27 ENCOUNTER — Other Ambulatory Visit (HOSPITAL_COMMUNITY)
Admission: RE | Admit: 2012-07-27 | Discharge: 2012-07-27 | Disposition: A | Discharge status: Home / Self Care | Payer: 59 | Source: Ambulatory Visit | Attending: Family Medicine | Admitting: Family Medicine

## 2012-07-27 VITALS — BP 116/57 | HR 67 | Temp 98.1°F | Ht 66.5 in | Wt 146.0 lb

## 2012-07-27 DIAGNOSIS — N898 Other specified noninflammatory disorders of vagina: Secondary | ICD-10-CM

## 2012-07-27 DIAGNOSIS — Z113 Encounter for screening for infections with a predominantly sexual mode of transmission: Secondary | ICD-10-CM | POA: Insufficient documentation

## 2012-07-27 DIAGNOSIS — Z7251 High risk heterosexual behavior: Secondary | ICD-10-CM

## 2012-07-27 DIAGNOSIS — N760 Acute vaginitis: Secondary | ICD-10-CM

## 2012-07-27 LAB — POCT WET PREP (WET MOUNT)

## 2012-07-27 MED ORDER — NORGESTIMATE-ETH ESTRADIOL 0.25-35 MG-MCG PO TABS: 1.0000 | ORAL_TABLET | Freq: Every day | ORAL | Status: DC

## 2012-07-27 MED ORDER — METRONIDAZOLE 0.75 % VA GEL: VAGINAL | Status: DC

## 2012-07-27 NOTE — Progress Notes (Signed)
Subjective:     Patient ID: Amy Barrett, female   DOB: 1990/06/27, 22 y.o.   MRN: [id number]  HPI  Follow up for Chronic BV: Patient is here for her follow up to chronic BV. She states she has taken the medication as prescribed the past month, but is still having symptoms. She describes a discharge that is slightly decreased from last time, but still irritating. She does not notice a smell with this discharge. She has changed partners and does not use condoms or birth Control. Her LMP was 3/18 and was normal.    Review of Systems No fever, weight loss or abdominal pain.     Objective:   Physical Exam BP 116/57  Pulse 67  Temp(Src) 98.1 F (36.7 C) (Oral)  Ht 5' 6.5" (1.689 m)  Wt 146 lb (66.225 kg)  BMI 23.21 kg/m2 Gen: NAD. Pleasant.  ABD: Soft. ND. Mild TTP RLQ. BS+  GU: GYN:  External genitalia within normal limits.  Vaginal mucosa pink, moist, normal rugae.  Nonfriable cervix without lesions, no discharge or bleeding noted on speculum exam.  Bimanual exam revealed normal, nongravid uterus.  No cervical motion tenderness. No adnexal masses bilaterally.  Mild discomfort with exam. Scant watery white discharge. No foul smell noted.

## 2012-07-27 NOTE — Assessment & Plan Note (Addendum)
-   Wet prep repeated today and it is normal. Will continue with plan of metro gel X2 nights a week for 2 months HS for chronic BV.  - Patient has been with new partner. - Counciled on safe sex. - Offered BC options today. Patient elected to start BCP. - Prescribed Sprintec.  - G/C urine sent, because of new partner and mild abd pain with exam.  - F/U: PRN with PCP

## 2012-07-27 NOTE — Patient Instructions (Addendum)
It was nice to see you again today. Your exam was normal today  . It is very important for your personal health to start using condoms. There are many STD's that can have long term affects to your health and future child bearing capabilities.  Safer Sex Your caregiver wants you to have this information about the infections that can be transmitted from sexual contact and how to prevent them. The idea behind safer sex is that you can be sexually active, and at the same time reduce the risk of giving or getting a sexually transmitted disease (STD). Every person should be aware of how to prevent him or herself and his or her sex partner from getting an STD. CAUSES OF STDS STDs are transmitted by sharing body fluids, which contain viruses and bacteria. The following fluids all transmit infections during sexual intercourse and sex acts:  Semen.  Saliva.  Urine.  Blood.  Vaginal mucus. Examples of STDs include:  Chlamydia.  Gonorrhea.  Genital herpes.  Hepatitis B.  Human immunodeficiency virus or acquired immunodeficiency syndrome (HIV or AIDS).  Syphilis.  Trichomonas.  Pubic lice.  Human papillomavirus (HPV), which may include:  Genital warts.  Cervical dysplasia.  Cervical cancer (can develop with certain types of HPV). SYMPTOMS  Sexual diseases often cause few or no symptoms until they are advanced, so a person can be infected and spread the infection without knowing it. Some STDs respond to treatment very well. Others, like HIV and herpes, cannot be cured, but are treated to reduce their effects. Specific symptoms include:  Abnormal vaginal discharge.  Irritation or itching in and around the vagina, and in the pubic hair.  Pain during sexual intercourse.  Bleeding during sexual intercourse.  Pelvic or abdominal pain.  Fever.  Growths in and around the vagina.  An ulcer in or around the vagina.  Swollen glands in the groin area. DIAGNOSIS   Blood  tests.  Pap test.  Culture test of abnormal vaginal discharge.  A test that applies a solution and examines the cervix with a lighted magnifying scope (colposcopy).  A test that examines the pelvis with a lighted tube, through a small incision (laparoscopy). TREATMENT  The treatment will depend on the cause of the STD.  Antibiotic treatment by injection, oral, creams, or suppositories in the vagina.  Over-the-counter medicated shampoo, to get rid of pubic lice.  Removing or treating growths with medicine, freezing, burning (electrocautery), or surgery.  Surgery treatment for HPV of the cervix.  Supportive medicines for herpes, HIV, AIDS, and hepatitis. Being careful cannot eliminate all risk of infection, but sex can be made much safer. Safe sexual practices include body massage and gentle touching. Masturbation is safe, as long as body fluids do not contact skin that has sores or cuts. Dry kissing and oral sex on a man wearing a latex condom or on a woman wearing a female condom is also safe. Slightly less safe is intercourse while the man wears a latex condom or wet kissing. It is also safer to have one sex partner that you know is not having sex with anyone else. LENGTH OF ILLNESS An STD might be treated and cured in a week, sometimes a month, or more. And it can linger with symptoms for many years. STDs can also cause damage to the female organs. This can cause chronic pain, infertility, and recurrence of the STD, especially herpes, hepatitis, HIV, and HPV. HOME CARE INSTRUCTIONS AND PREVENTION  Alcohol and recreational drugs are often the  reason given for not practicing safer sex. These substances affect your judgment. Alcohol and recreational drugs can also impair your immune system, making you more vulnerable to disease.  Do not engage in risky and dangerous sexual practices, including:  Vaginal or anal sex without a condom.  Oral sex on a man without a condom.  Oral sex on  a woman without a female condom.  Using saliva to lubricate a condom.  Any other sexual contact in which body fluids or blood from one partner contact the other partner.  You should use only latex condoms for men and water soluble lubricants. Petroleum based lubricants or oils used to lubricate a condom will weaken the condom and increase the chance that it will break.  Think very carefully before having sex with anyone who is high risk for STDs and HIV. This includes IV drug users, people with multiple sexual partners, or people who have had an STD, or a positive hepatitis or HIV blood test.  Remember that even if your partner has had only one previous partner, their previous partner might have had multiple partners. If so, you are at high risk of being exposed to an STD. You and your sex partner should be the only sex partners with each other, with no one else involved.  A vaccine is available for hepatitis B and HPV through your caregiver or the Public Health Department. Everyone should be vaccinated with these vaccines.  Avoid risky sex practices. Sex acts that can break the skin make you more likely to get an STD. SEEK MEDICAL CARE IF:   If you think you have an STD, even if you do not have any symptoms. Contact your caregiver for evaluation and treatment, if needed.  You think or know your sex partner has acquired an STD.  You have any of the symptoms mentioned above. Document Released: 05/27/2004 Document Revised: 07/12/2011 Document Reviewed: 03/19/2009 [facility] Patient Information 2013 [place], [place].

## 2012-07-27 NOTE — Assessment & Plan Note (Addendum)
-   counciled on safe sex today and condom use - G/C urine - Started BCP today (Sprintec) - F/u PRN

## 2012-07-28 ENCOUNTER — Telehealth: Payer: Self-pay | Admitting: Family Medicine

## 2012-07-28 NOTE — Telephone Encounter (Signed)
The pharmacy needs clarification on the instructions for Flagyl.

## 2012-07-28 NOTE — Telephone Encounter (Signed)
Called and [facility]  WR:[id number] of orders for flagyl at [facility] pharmacy.

## 2012-07-31 ENCOUNTER — Encounter: Payer: Self-pay | Admitting: Family Medicine

## 2012-10-10 ENCOUNTER — Telehealth: Payer: Self-pay | Admitting: Family Medicine

## 2012-10-10 NOTE — Telephone Encounter (Signed)
Patient dropped off FMLA forms to be filled out.  Please call her when completed. °

## 2012-10-18 ENCOUNTER — Telehealth: Payer: Self-pay | Admitting: Family Medicine

## 2012-10-18 NOTE — Telephone Encounter (Signed)
Called patient and left message letting patient know that FMLA paperwork is available for pick up.  She has primary dysmenorrhea which disables her for 1 day per month with her period. I asked her a few days ago by phone to make an appointment with me so that we can discuss referral options to gynecology.   Marena Chancy, PGY-2 Family Medicine Resident

## 2012-12-05 ENCOUNTER — Telehealth: Payer: Self-pay | Admitting: Family Medicine

## 2012-12-05 NOTE — Telephone Encounter (Signed)
Pt is calling because she went to [facility] and checked her  BP  And it was 144/89. I tried to make an appointment for 8/6 but she didn't want that and she said her family has an  History of high BP, She has been having headaches for about week and wants to get some advice JW

## 2012-12-05 NOTE — Telephone Encounter (Signed)
Returned call to pt. Educated on low salt diet, healthy eating habits, keeping daily record of blood pressure if possible - recommended motrin/tylenol for headache - appointment if symptoms continue or if she feels need for evaluation of BP - pt verbalized understanding. Wyatt Haste, RN-BSN

## 2013-01-25 ENCOUNTER — Ambulatory Visit: Payer: 59 | Admitting: Family Medicine

## 2013-01-25 ENCOUNTER — Emergency Department (HOSPITAL_COMMUNITY)
Admission: EM | Admit: 2013-01-25 | Discharge: 2013-01-25 | Disposition: A | Discharge status: Home / Self Care | Payer: 59 | Source: Home / Self Care | Attending: Family Medicine | Admitting: Family Medicine

## 2013-01-25 ENCOUNTER — Encounter (HOSPITAL_COMMUNITY): Payer: Self-pay | Admitting: Emergency Medicine

## 2013-01-25 DIAGNOSIS — M67432 Ganglion, left wrist: Secondary | ICD-10-CM

## 2013-01-25 DIAGNOSIS — M6740 Ganglion, unspecified site: Secondary | ICD-10-CM

## 2013-01-25 MED ORDER — NAPROXEN 500 MG PO TABS: 500.0000 mg | ORAL_TABLET | Freq: Two times a day (BID) | ORAL | Status: DC

## 2013-01-25 MED ORDER — METHYLPREDNISOLONE ACETATE 40 MG/ML IJ SUSP
INTRAMUSCULAR | Status: AC
  Filled 2013-01-25: qty 5

## 2013-01-25 MED ORDER — METHYLPREDNISOLONE ACETATE 40 MG/ML IJ SUSP
5.0000 mg | Freq: Once | INTRAMUSCULAR | Status: AC
  Administered 2013-01-25: 5.2 mg via INTRAMUSCULAR

## 2013-01-25 NOTE — ED Provider Notes (Signed)
CSN: [id number]     Arrival date & time 01/25/13  1108 History   First MD Initiated Contact with Patient 01/25/13 1156     Chief Complaint  Patient presents with  . Cyst   (Consider location/radiation/quality/duration/timing/severity/associated sxs/prior Treatment) HPI Comments: 22 year old female presents complaining of not on her left wrist she first noticed 2 days ago. The area is swollen and painful. She was told by coworkers that she has a ganglion cyst in her wrist. She is here to get this evaluated to what she can do about it. She has no previous history of cysts in the wrist. She denies any numbness in the hand or pain with moving her fingers. She denies any injury.   Past Medical History  Diagnosis Date  . BV (bacterial vaginosis)   . Yeast vaginitis   . Dysmenorrhea since adolescence    [place] in 2011 that did not show any acute findings. Seen by Dr. Vickey Sages   History reviewed. No pertinent past surgical history. Family History  Problem Relation Age of Onset  . Hypertension Mother   . Breast cancer Maternal Grandmother 76   History  Substance Use Topics  . Smoking status: Never Smoker   . Smokeless tobacco: Not on file  . Alcohol Use: Yes     Comment: occasional, social   OB History   Grav Para Term Preterm Abortions TAB SAB Ect Mult Living                 Review of Systems  Constitutional: Negative for fever and chills.  Eyes: Negative for visual disturbance.  Respiratory: Negative for cough and shortness of breath.   Cardiovascular: Negative for chest pain, palpitations and leg swelling.  Gastrointestinal: Negative for nausea, vomiting and abdominal pain.  Endocrine: Negative for polydipsia and polyuria.  Genitourinary: Negative for dysuria, urgency and frequency.  Musculoskeletal: Positive for arthralgias.       See HPI  Skin: Negative for rash.  Neurological: Negative for dizziness, weakness and light-headedness.    Allergies  Review of patient's allergies  indicates no known allergies.  Home Medications   Current Outpatient Rx  Name  Route  Sig  Dispense  Refill  . metroNIDAZOLE (FLAGYL) 500 MG tablet      Take twice a day with food for 7 days, then once a day for remaining 21 days.   35 tablet   0   . metroNIDAZOLE (METROGEL VAGINAL) 0.75 % vaginal gel      Place one full applicator 2x a week before bedtime for two months   70 g   1   . naproxen (NAPROSYN) 500 MG tablet   Oral   Take 1 tablet (500 mg total) by mouth 2 (two) times daily with a meal.   60 tablet   0   . norgestimate-ethinyl estradiol (SPRINTEC 28) 0.25-35 MG-MCG tablet   Oral   Take 1 tablet by mouth daily.   1 Package   11    BP 136/81  Pulse 68  Temp(Src) 98.3 F (36.8 C) (Oral)  Resp 18  SpO2 100%  LMP 01/21/2013 Physical Exam  Nursing note and vitals reviewed. Constitutional: She is oriented to person, place, and time. Vital signs are normal. She appears well-developed and well-nourished. No distress.  HENT:  Head: Normocephalic and atraumatic.  Pulmonary/Chest: Effort normal. No respiratory distress.  Musculoskeletal:       Arms: Neurological: She is alert and oriented to person, place, and time. She has normal strength. Coordination normal.  Skin: Skin is warm and dry. No rash noted. She is not diaphoretic.  Psychiatric: She has a normal mood and affect. Judgment normal.    ED Course  Procedures (including critical care time) Labs Review Labs Reviewed - No data to display Imaging Review No results found.  Ultrasound performed by Dr. Denyse Amass, nodule is consistent with a ganglion cyst.   Aspiration and depo-medrol injection performed by Dr. Denyse Amass.  Pt tolerated procedure well.    MDM   1. Ganglion cyst of wrist, left    Placed in a wrist splint. Oral Naprosyn twice a day. Followup with orthopedics if not improving   Meds ordered this encounter  Medications  . methylPREDNISolone acetate (DEPO-MEDROL) injection 5.2 mg    Sig:   .  naproxen (NAPROSYN) 500 MG tablet    Sig: Take 1 tablet (500 mg total) by mouth 2 (two) times daily with a meal.    Dispense:  60 tablet    Refill:  0      Graylon Good, PA-C 01/25/13 1336

## 2013-01-25 NOTE — ED Notes (Signed)
Patient reports noticing a knot on left wrist 2 days ago.  No known injury.  Knot is visible to back of wrist

## 2013-01-25 NOTE — ED Provider Notes (Signed)
Left dorsal wrist limited musculoskeletal ultrasound: Hypoechoic loculated cystic structure located on the dorsal wrist consistent in appearance with ganglion cysts. No Doppler flow. Bony structures normal-appearing.  Procedure note aspiration and injection of left dorsal wrist ganglion cyst: Consent obtained and timeout performed. Patient was warned about risks and benefits of the procedure specifically about risk of hypopigmentation of the skin. The overlying skin was cleaned with alcohol and anesthetized with about 1 ml of lidocaine. Betadine was then applied. An 18-gauge needle was used to access the cyst and a small amount of gelatinous material was aspirated. About 5 mg of Depo-Medrol and 0.25 mL of lidocaine were injected into the decompressed cyst. Patient tolerated procedure well.  Clementeen Graham, MD  Rodolph Bong, MD 01/25/13 1310

## 2013-01-26 NOTE — ED Provider Notes (Signed)
Medical screening examination/treatment/procedure(s) were performed by a resident physician or non-physician practitioner and as the supervising physician I was immediately available for consultation/collaboration.  Clementeen Graham, MD   Rodolph Bong, MD 01/26/13 [phone]

## 2013-07-26 ENCOUNTER — Encounter: Payer: Self-pay | Admitting: Family Medicine

## 2013-07-26 ENCOUNTER — Ambulatory Visit (INDEPENDENT_AMBULATORY_CARE_PROVIDER_SITE_OTHER): Payer: 59 | Admitting: Family Medicine

## 2013-07-26 VITALS — BP 130/70 | HR 65 | Ht 66.5 in | Wt 156.0 lb

## 2013-07-26 DIAGNOSIS — Z7251 High risk heterosexual behavior: Secondary | ICD-10-CM

## 2013-07-26 DIAGNOSIS — N946 Dysmenorrhea, unspecified: Secondary | ICD-10-CM

## 2013-07-26 DIAGNOSIS — N944 Primary dysmenorrhea: Secondary | ICD-10-CM

## 2013-07-26 MED ORDER — DICLOFENAC SODIUM 75 MG PO TBEC: 75.0000 mg | DELAYED_RELEASE_TABLET | Freq: Two times a day (BID) | ORAL | Status: DC

## 2013-07-26 MED ORDER — ONDANSETRON 4 MG PO TBDP: 4.0000 mg | ORAL_TABLET | Freq: Three times a day (TID) | ORAL | Status: DC | PRN

## 2013-07-26 MED ORDER — NORGESTIMATE-ETH ESTRADIOL 0.25-35 MG-MCG PO TABS: 1.0000 | ORAL_TABLET | Freq: Every day | ORAL | Status: DC

## 2013-07-26 NOTE — Progress Notes (Signed)
Patient ID: Prudencio BurlyAyesha Fata    DOB: April 17, 1991, 23 y.o.   MRN: [id number] --- Subjective:  Gust Rungyesha is a 23 y.o.female who presents with dysmenorrhea.  Problem has been ongoing since adolescence. She has regular periods, every 21 days, periods last 5 days. Uses 2-3 pads a day. Cramping occurs on second day of periods and keeps her out of work due to severity. She has associated vomiting. Pain is 10/10 when it occurs. She is not able to eat. She takes 2x800mg  ibuprofen tablets that makes her go to sleep. Pain generally subsides the next day and she is able to go to work.  She stopped taking the birth control pills 1-2 months after starting them because they did not work.   ROS: see HPI Past Medical History: reviewed and updated medications and allergies. Social History: Tobacco: none  Objective: Filed Vitals:   07/26/13 1403  BP: 130/70  Pulse: 65    Physical Examination:   General appearance - alert, well appearing, and in no distress Chest - clear to auscultation, no wheezes, rales or rhonchi, symmetric air entry Heart - normal rate, regular rhythm, normal S1, S2, no murmurs Abdomen - soft, nontender, nondistended, no masses

## 2013-07-26 NOTE — Patient Instructions (Addendum)
Follow up for a PAP smear.   For the dysmenorrhea, try the pills again and let's try a different NSAID.  If this doesn't work in the next 3-4 months, please come back.

## 2013-07-28 NOTE — Assessment & Plan Note (Addendum)
Patient doesn't want to try depo injections at this time. She would like to try OCP's again. Encouraged her to take it for more than 1-2 months. Will change NSAIDs from ibuprofen to diclofenac. zofran for nausea and vomiting. Follow up in 2-3 months regarding this.  Patient was also instructed to return for well woman exam since she is due for her PAP

## 2013-09-25 ENCOUNTER — Encounter: Payer: Self-pay | Admitting: Family Medicine

## 2013-09-25 NOTE — Progress Notes (Signed)
Form placed in PCP box for completion.Doris Cheadle Miriah Maruyama

## 2013-09-25 NOTE — Progress Notes (Signed)
Pt dropped off forms to be filled out regarding FMLA.

## 2013-09-28 NOTE — Progress Notes (Signed)
Please let patient know that FMLA paperwork is ready for patient to pick up at the front desk. Thank you!  Marena Chancy, PGY-3 Family Medicine Resident

## 2013-10-01 ENCOUNTER — Telehealth: Payer: Self-pay | Admitting: *Deleted

## 2013-10-01 NOTE — Telephone Encounter (Signed)
Called and informed to pick up FMLA form. Arlyss Repress

## 2013-11-20 ENCOUNTER — Encounter: Payer: Self-pay | Admitting: Family Medicine

## 2013-11-20 ENCOUNTER — Ambulatory Visit (INDEPENDENT_AMBULATORY_CARE_PROVIDER_SITE_OTHER): Payer: 59 | Admitting: Family Medicine

## 2013-11-20 VITALS — BP 113/66 | HR 67 | Temp 98.4°F | Resp 16 | Wt 155.0 lb

## 2013-11-20 DIAGNOSIS — J029 Acute pharyngitis, unspecified: Secondary | ICD-10-CM | POA: Insufficient documentation

## 2013-11-20 LAB — POCT RAPID STREP A (OFFICE): RAPID STREP A SCREEN: NEGATIVE

## 2013-11-20 NOTE — Progress Notes (Signed)
[facility]  Joanna Puffrystal S. Dorsey, MD  Subjective:  Chief complaint: swollen tonsil   Patient presents with a right swollen tonsil. She notes that 5 days ago she had a sore throat which resolved 2-3days ago with Advil and throat lozenges. She noticed at that time both her tonsils were swollen with white exudates on them. The left tonsillar swelling has resolved. The right tonsillar swelling has improved but continues to be swollen. She endorses hoarseness and the feeling of drainage in her throat. No odynophagia, dysphagia, or problems breathing. No sick contacts.   ROS- Denies rhinorrhea, nasal congestion, facial pain, water eyes, cough, fever/chills, SOB, chest pain   Past Medical History Patient Active Problem List   Diagnosis Date Noted  . Pharyngitis 11/20/2013  . High risk sexual behavior 10/14/2011  . Primary dysmenorrhea 09/01/2011    Medications: No current medications.  Objective: BP 113/66  Pulse 67  Temp(Src) 98.4 F (36.9 C) (Oral)  Resp 16  Wt 155 lb (70.308 kg)  SpO2 99%  LMP 11/06/2013 Gen: No acute distress. Alert, cooperative with exam HEENT: Atraumatic, PERRLA, MMM. Tonsils erythematous without exudate. Right tonsil slightly larger than the left. Tonsillar arch intact with no evidence of a peritonsillar abscess. No LAD noted. CV: RRR. No murmurs, rubs, or gallops noted. 2+ radial pulses bilaterally. Resp: CTAB. No wheezing, crackles, or rhonchi noted. Ext: No edema. No rashes noted. Neuro: Alert and oriented, No gross focal deficits   Assessment/Plan:  Pharyngitis Most likely secondary to a viral infection. Symptoms appear to be resolving appropriately.   Rapid strep test performed give pt was afebrile and she noted seeing tonsillar exudates previously, however this was negative. No indication on physical exam or in history to think this is a peritonsillar abscess. Discussed reasons to seek medical assistance: difficulty breathing, SOB,  difficulty with swallowing, worsening symptoms. Voiced understanding. F/U PRN and with PCP routinely.     Orders Placed This Encounter  Procedures  . Rapid Strep A    No orders of the defined types were placed in this encounter.

## 2013-11-20 NOTE — Assessment & Plan Note (Addendum)
Most likely secondary to a viral infection. Symptoms appear to be resolving appropriately.   Rapid strep test performed give pt was afebrile and she noted seeing tonsillar exudates previously, however this was negative. No indication on physical exam or in history to think this is a peritonsillar abscess. Discussed reasons to seek medical assistance: difficulty breathing, SOB, difficulty with swallowing, worsening symptoms. Voiced understanding. F/U PRN and with PCP routinely.

## 2013-11-20 NOTE — Patient Instructions (Signed)
You came in with the right tonsil more swollen than the left after an infection. You infection was most likely secondary to a viral infection as the Strep throat swab was negative. The good news is that things seem to be resolving appropriately. Please return or seek emergency medical assistance if you have difficulty breathing or difficulty swallowing.  Pharyngitis Pharyngitis is redness, pain, and swelling (inflammation) of your pharynx.  CAUSES  Pharyngitis is usually caused by infection. Most of the time, these infections are from viruses (viral) and are part of a cold. However, sometimes pharyngitis is caused by bacteria (bacterial). Pharyngitis can also be caused by allergies. Viral pharyngitis may be spread from person to person by coughing, sneezing, and personal items or utensils (cups, forks, spoons, toothbrushes). Bacterial pharyngitis may be spread from person to person by more intimate contact, such as kissing.  SIGNS AND SYMPTOMS  Symptoms of pharyngitis include:   Sore throat.   Tiredness (fatigue).   Low-grade fever.   Headache.  Joint pain and muscle aches.  Skin rashes.  Swollen lymph nodes.  Plaque-like film on throat or tonsils (often seen with bacterial pharyngitis). DIAGNOSIS  Your health care provider will ask you questions about your illness and your symptoms. Your medical history, along with a physical exam, is often all that is needed to diagnose pharyngitis. Sometimes, a rapid strep test is done. Other lab tests may also be done, depending on the suspected cause.  TREATMENT  Viral pharyngitis will usually get better in 3-4 days without the use of medicine. Bacterial pharyngitis is treated with medicines that kill germs (antibiotics).  HOME CARE INSTRUCTIONS   Drink enough water and fluids to keep your urine clear or pale yellow.   Only take over-the-counter or prescription medicines as directed by your health care provider:   If you are prescribed  antibiotics, make sure you finish them even if you start to feel better.   Do not take aspirin.   Get lots of rest.   Gargle with 8 oz of salt water ( tsp of salt per 1 qt of water) as often as every 1-2 hours to soothe your throat.   Throat lozenges (if you are not at risk for choking) or sprays may be used to soothe your throat. SEEK MEDICAL CARE IF:   You have large, tender lumps in your neck.  You have a rash.  You cough up green, yellow-brown, or bloody spit. SEEK IMMEDIATE MEDICAL CARE IF:   Your neck becomes stiff.  You drool or are unable to swallow liquids.  You vomit or are unable to keep medicines or liquids down.  You have severe pain that does not go away with the use of recommended medicines.  You have trouble breathing (not caused by a stuffy nose). MAKE SURE YOU:   Understand these instructions.  Will watch your condition.  Will get help right away if you are not doing well or get worse. Document Released: 04/19/2005 Document Revised: 02/07/2013 Document Reviewed: 12/25/2012 [facility] Patient Information 2015 [employer], [place]. This information is not intended to replace advice given to you by your health care provider. Make sure you discuss any questions you have with your health care provider.

## 2013-11-21 NOTE — Progress Notes (Addendum)
Patient ID: Amy Barrett, female   DOB: 11/15/1990, 23 y.o.   MRN: [id number] Discuss with Dr. Leonides Schanzorsey.  I agree with her documentation and management.

## 2014-08-08 ENCOUNTER — Ambulatory Visit (INDEPENDENT_AMBULATORY_CARE_PROVIDER_SITE_OTHER): Payer: 59 | Admitting: Family Medicine

## 2014-08-08 ENCOUNTER — Other Ambulatory Visit (HOSPITAL_COMMUNITY)
Admission: RE | Admit: 2014-08-08 | Discharge: 2014-08-08 | Disposition: A | Discharge status: Home / Self Care | Payer: 59 | Source: Ambulatory Visit | Attending: Family Medicine | Admitting: Family Medicine

## 2014-08-08 VITALS — BP 127/63 | HR 73 | Temp 98.1°F | Ht 67.0 in | Wt 142.7 lb

## 2014-08-08 DIAGNOSIS — Z113 Encounter for screening for infections with a predominantly sexual mode of transmission: Secondary | ICD-10-CM | POA: Diagnosis present

## 2014-08-08 DIAGNOSIS — N760 Acute vaginitis: Secondary | ICD-10-CM | POA: Insufficient documentation

## 2014-08-08 DIAGNOSIS — Z01419 Encounter for gynecological examination (general) (routine) without abnormal findings: Secondary | ICD-10-CM

## 2014-08-08 DIAGNOSIS — Z3009 Encounter for other general counseling and advice on contraception: Secondary | ICD-10-CM

## 2014-08-08 DIAGNOSIS — Z862 Personal history of diseases of the blood and blood-forming organs and certain disorders involving the immune mechanism: Secondary | ICD-10-CM

## 2014-08-08 DIAGNOSIS — Z124 Encounter for screening for malignant neoplasm of cervix: Secondary | ICD-10-CM | POA: Diagnosis not present

## 2014-08-08 DIAGNOSIS — N944 Primary dysmenorrhea: Secondary | ICD-10-CM

## 2014-08-08 DIAGNOSIS — Z3042 Encounter for surveillance of injectable contraceptive: Secondary | ICD-10-CM

## 2014-08-08 DIAGNOSIS — Z13220 Encounter for screening for lipoid disorders: Secondary | ICD-10-CM

## 2014-08-08 LAB — LIPID PANEL
Cholesterol: 132 mg/dL (ref 0–200)
HDL: 48 mg/dL (ref >=46)
LDL Cholesterol: 77 mg/dL (ref 0–99)
Total CHOL/HDL Ratio: 2.8 Ratio
Triglycerides: 37 mg/dL (ref <150)
VLDL: 7 mg/dL (ref 0–40)

## 2014-08-08 LAB — COMPREHENSIVE METABOLIC PANEL
ALT: 10 U/L (ref 0–35)
AST: 19 U/L (ref 0–37)
Albumin: 4.7 g/dL (ref 3.5–5.2)
Alkaline Phosphatase: 52 U/L (ref 39–117)
BUN: 13 mg/dL (ref 6–23)
CO2: 25 mEq/L (ref 19–32)
Calcium: 9.6 mg/dL (ref 8.4–10.5)
Chloride: 104 mEq/L (ref 96–112)
Creat: 0.7 mg/dL (ref 0.50–1.10)
Glucose, Bld: 71 mg/dL (ref 70–99)
Potassium: 4.8 mEq/L (ref 3.5–5.3)
Sodium: 138 mEq/L (ref 135–145)
Total Bilirubin: 0.6 mg/dL (ref 0.2–1.2)
Total Protein: 7.5 g/dL (ref 6.0–8.3)

## 2014-08-08 LAB — CBC
HCT: 37.3 % (ref 36.0–46.0)
Hemoglobin: 12.3 g/dL (ref 12.0–15.0)
MCH: 29 pg (ref 26.0–34.0)
MCHC: 33 g/dL (ref 30.0–36.0)
MCV: 88 fL (ref 78.0–100.0)
MPV: 10.7 fL (ref 8.6–12.4)
Platelets: 243 10*3/uL (ref 150–400)
RBC: 4.24 MIL/uL (ref 3.87–5.11)
RDW: 13.2 % (ref 11.5–15.5)
WBC: 7.5 10*3/uL (ref 4.0–10.5)

## 2014-08-08 LAB — POCT WET PREP (WET MOUNT): Clue Cells Wet Prep Whiff POC: POSITIVE

## 2014-08-08 LAB — POCT URINE PREGNANCY: Preg Test, Ur: NEGATIVE

## 2014-08-08 MED ORDER — MEDROXYPROGESTERONE ACETATE 150 MG/ML IM SUSP
150.0000 mg | Freq: Once | INTRAMUSCULAR | Status: AC
  Administered 2014-08-08: 150 mg via INTRAMUSCULAR

## 2014-08-08 MED ORDER — IBUPROFEN 800 MG PO TABS: 800.0000 mg | ORAL_TABLET | Freq: Three times a day (TID) | ORAL | Status: DC | PRN

## 2014-08-08 MED ORDER — METRONIDAZOLE 500 MG PO TABS: 500.0000 mg | ORAL_TABLET | Freq: Two times a day (BID) | ORAL | Status: DC

## 2014-08-08 NOTE — Patient Instructions (Addendum)
Follow up in 3 months for your next depo I will call you if your test results are not normal.  Otherwise, I will send you a letter.  If you do not hear from me with in 2 weeks please call our office.      Medroxyprogesterone injection [Contraceptive] What is this medicine? MEDROXYPROGESTERONE (me DROX ee proe JES te rone) contraceptive injections prevent pregnancy. They provide effective birth control for 3 months. Depo-subQ Provera 104 is also used for treating pain related to endometriosis. This medicine may be used for other purposes; ask your health care provider or pharmacist if you have questions. COMMON BRAND NAME(S): Depo-Provera, Depo-subQ Provera 104 What should I tell my health care provider before I take this medicine? They need to know if you have any of these conditions: -frequently drink alcohol -asthma -blood vessel disease or a history of a blood clot in the lungs or legs -bone disease such as osteoporosis -breast cancer -diabetes -eating disorder (anorexia nervosa or bulimia) -high blood pressure -HIV infection or AIDS -kidney disease -liver disease -mental depression -migraine -seizures (convulsions) -stroke -tobacco smoker -vaginal bleeding -an unusual or allergic reaction to medroxyprogesterone, other hormones, medicines, foods, dyes, or preservatives -pregnant or trying to get pregnant -breast-feeding How should I use this medicine? Depo-Provera Contraceptive injection is given into a muscle. Depo-subQ Provera 104 injection is given under the skin. These injections are given by a health care professional. You must not be pregnant before getting an injection. The injection is usually given during the first 5 days after the start of a menstrual period or 6 weeks after delivery of a baby. Talk to your pediatrician regarding the use of this medicine in children. Special care may be needed. These injections have been used in female children who have started having  menstrual periods. Overdosage: If you think you have taken too much of this medicine contact a poison control center or emergency room at once. NOTE: This medicine is only for you. Do not share this medicine with others. What if I miss a dose? Try not to miss a dose. You must get an injection once every 3 months to maintain birth control. If you cannot keep an appointment, call and reschedule it. If you wait longer than 13 weeks between Depo-Provera contraceptive injections or longer than 14 weeks between Depo-subQ Provera 104 injections, you could get pregnant. Use another method for birth control if you miss your appointment. You may also need a pregnancy test before receiving another injection. What may interact with this medicine? Do not take this medicine with any of the following medications: -bosentan This medicine may also interact with the following medications: -aminoglutethimide -antibiotics or medicines for infections, especially rifampin, rifabutin, rifapentine, and griseofulvin -aprepitant -barbiturate medicines such as phenobarbital or primidone -bexarotene -carbamazepine -medicines for seizures like ethotoin, felbamate, oxcarbazepine, phenytoin, topiramate -modafinil -St. John's wort This list may not describe all possible interactions. Give your health care provider a list of all the medicines, herbs, non-prescription drugs, or dietary supplements you use. Also tell them if you smoke, drink alcohol, or use illegal drugs. Some items may interact with your medicine. What should I watch for while using this medicine? This drug does not protect you against HIV infection (AIDS) or other sexually transmitted diseases. Use of this product may cause you to lose calcium from your bones. Loss of calcium may cause weak bones (osteoporosis). Only use this product for more than 2 years if other forms of birth control are not right  for you. The longer you use this product for birth control  the more likely you will be at risk for weak bones. Ask your health care professional how you can keep strong bones. You may have a change in bleeding pattern or irregular periods. Many females stop having periods while taking this drug. If you have received your injections on time, your chance of being pregnant is very low. If you think you may be pregnant, see your health care professional as soon as possible. Tell your health care professional if you want to get pregnant within the next year. The effect of this medicine may last a long time after you get your last injection. What side effects may I notice from receiving this medicine? Side effects that you should report to your doctor or health care professional as soon as possible: -allergic reactions like skin rash, itching or hives, swelling of the face, lips, or tongue -breast tenderness or discharge -breathing problems -changes in vision -depression -feeling faint or lightheaded, falls -fever -pain in the abdomen, chest, groin, or leg -problems with balance, talking, walking -unusually weak or tired -yellowing of the eyes or skin Side effects that usually do not require medical attention (report to your doctor or health care professional if they continue or are bothersome): -acne -fluid retention and swelling -headache -irregular periods, spotting, or absent periods -temporary pain, itching, or skin reaction at site where injected -weight gain This list may not describe all possible side effects. Call your doctor for medical advice about side effects. You may report side effects to FDA at 1-800-FDA-1088. Where should I keep my medicine? This does not apply. The injection will be given to you by a health care professional. NOTE: This sheet is a summary. It may not cover all possible information. If you have questions about this medicine, talk to your doctor, pharmacist, or health care provider.  2015, [employer]vier/[employer] Standard.  (2008-05-10 18:37:56)   Health Maintenance Adopting a healthy lifestyle and getting preventive care can go a long way to promote health and wellness. Talk with your health care provider about what schedule of regular examinations is right for you. This is a good chance for you to check in with your provider about disease prevention and staying healthy. In between checkups, there are plenty of things you can do on your own. Experts have done a lot of research about which lifestyle changes and preventive measures are most likely to keep you healthy. Ask your health care provider for more information. WEIGHT AND DIET  Eat a healthy diet  Be sure to include plenty of vegetables, fruits, low-fat dairy products, and lean protein.  Do not eat a lot of foods high in solid fats, added sugars, or salt.  Get regular exercise. This is one of the most important things you can do for your health.  Most adults should exercise for at least 150 minutes each week. The exercise should increase your heart rate and make you sweat (moderate-intensity exercise).  Most adults should also do strengthening exercises at least twice a week. This is in addition to the moderate-intensity exercise.  Maintain a healthy weight  Body mass index (BMI) is a measurement that can be used to identify possible weight problems. It estimates body fat based on height and weight. Your health care provider can help determine your BMI and help you achieve or maintain a healthy weight.  For females 21 years of age and older:   A BMI below 18.5 is considered underweight.  A BMI of 18.5 to 24.9 is normal.  A BMI of 25 to 29.9 is considered overweight.  A BMI of 30 and above is considered obese.  Watch levels of cholesterol and blood lipids  You should start having your blood tested for lipids and cholesterol at 26 years of age, then have this test every 5 years.  You may need to have your cholesterol levels checked more often  if:  Your lipid or cholesterol levels are high.  You are older than 26 years of age.  You are at high risk for heart disease.  CANCER SCREENING   Lung Cancer  Lung cancer screening is recommended for adults 53-29 years old who are at high risk for lung cancer because of a history of smoking.  A yearly low-dose CT scan of the lungs is recommended for people who:  Currently smoke.  Have quit within the past 15 years.  Have at least a 30-pack-year history of smoking. A pack year is smoking an average of one pack of cigarettes a day for 1 year.  Yearly screening should continue until it has been 15 years since you quit.  Yearly screening should stop if you develop a health problem that would prevent you from having lung cancer treatment.  Breast Cancer  Practice breast self-awareness. This means understanding how your breasts normally appear and feel.  It also means doing regular breast self-exams. Let your health care provider know about any changes, no matter how small.  If you are in your 20s or 30s, you should have a clinical breast exam (CBE) by a health care provider every 1-3 years as part of a regular health exam.  If you are 73 or older, have a CBE every year. Also consider having a breast X-ray (mammogram) every year.  If you have a family history of breast cancer, talk to your health care provider about genetic screening.  If you are at high risk for breast cancer, talk to your health care provider about having an MRI and a mammogram every year.  Breast cancer gene (BRCA) assessment is recommended for women who have family members with BRCA-related cancers. BRCA-related cancers include:  Breast.  Ovarian.  Tubal.  Peritoneal cancers.  Results of the assessment will determine the need for genetic counseling and BRCA1 and BRCA2 testing. Cervical Cancer Routine pelvic examinations to screen for cervical cancer are no longer recommended for nonpregnant women  who are considered low risk for cancer of the pelvic organs (ovaries, uterus, and vagina) and who do not have symptoms. A pelvic examination may be necessary if you have symptoms including those associated with pelvic infections. Ask your health care provider if a screening pelvic exam is right for you.   The Pap test is the screening test for cervical cancer for women who are considered at risk.  If you had a hysterectomy for a problem that was not cancer or a condition that could lead to cancer, then you no longer need Pap tests.  If you are older than 65 years, and you have had normal Pap tests for the past 10 years, you no longer need to have Pap tests.  If you have had past treatment for cervical cancer or a condition that could lead to cancer, you need Pap tests and screening for cancer for at least 20 years after your treatment.  If you no longer get a Pap test, assess your risk factors if they change (such as having a new sexual partner). This  can affect whether you should start being screened again.  Some women have medical problems that increase their chance of getting cervical cancer. If this is the case for you, your health care provider may recommend more frequent screening and Pap tests.  The human papillomavirus (HPV) test is another test that may be used for cervical cancer screening. The HPV test looks for the virus that can cause cell changes in the cervix. The cells collected during the Pap test can be tested for HPV.  The HPV test can be used to screen women 61 years of age and older. Getting tested for HPV can extend the interval between normal Pap tests from three to five years.  An HPV test also should be used to screen women of any age who have unclear Pap test results.  After 26 years of age, women should have HPV testing as often as Pap tests.  Colorectal Cancer  This type of cancer can be detected and often prevented.  Routine colorectal cancer screening usually  begins at 26 years of age and continues through 26 years of age.  Your health care provider may recommend screening at an earlier age if you have risk factors for colon cancer.  Your health care provider may also recommend using home test kits to check for hidden blood in the stool.  A small camera at the end of a tube can be used to examine your colon directly (sigmoidoscopy or colonoscopy). This is done to check for the earliest forms of colorectal cancer.  Routine screening usually begins at age 7.  Direct examination of the colon should be repeated every 5-10 years through 26 years of age. However, you may need to be screened more often if early forms of precancerous polyps or small growths are found. Skin Cancer  Check your skin from head to toe regularly.  Tell your health care provider about any new moles or changes in moles, especially if there is a change in a mole's shape or color.  Also tell your health care provider if you have a mole that is larger than the size of a pencil eraser.  Always use sunscreen. Apply sunscreen liberally and repeatedly throughout the day.  Protect yourself by wearing long sleeves, pants, a wide-brimmed hat, and sunglasses whenever you are outside. HEART DISEASE, DIABETES, AND HIGH BLOOD PRESSURE   Have your blood pressure checked at least every 1-2 years. High blood pressure causes heart disease and increases the risk of stroke.  If you are between 78 years and 63 years old, ask your health care provider if you should take aspirin to prevent strokes.  Have regular diabetes screenings. This involves taking a blood sample to check your fasting blood sugar level.  If you are at a normal weight and have a low risk for diabetes, have this test once every three years after 25 years of age.  If you are overweight and have a high risk for diabetes, consider being tested at a younger age or more often. PREVENTING INFECTION  Hepatitis B  If you have a  higher risk for hepatitis B, you should be screened for this virus. You are considered at high risk for hepatitis B if:  You were born in a country where hepatitis B is common. Ask your health care provider which countries are considered high risk.  Your parents were born in a high-risk country, and you have not been immunized against hepatitis B (hepatitis B vaccine).  You have HIV or AIDS.  You use needles to inject street drugs.  You live with someone who has hepatitis B.  You have had sex with someone who has hepatitis B.  You get hemodialysis treatment.  You take certain medicines for conditions, including cancer, organ transplantation, and autoimmune conditions. Hepatitis C  Blood testing is recommended for:  Everyone born from [birth year] through 1967.  Anyone with known risk factors for hepatitis C. Sexually transmitted infections (STIs)  You should be screened for sexually transmitted infections (STIs) including gonorrhea and chlamydia if:  You are sexually active and are younger than 26 years of age.  You are older than 26 years of age and your health care provider tells you that you are at risk for this type of infection.  Your sexual activity has changed since you were last screened and you are at an increased risk for chlamydia or gonorrhea. Ask your health care provider if you are at risk.  If you do not have HIV, but are at risk, it may be recommended that you take a prescription medicine daily to prevent HIV infection. This is called pre-exposure prophylaxis (PrEP). You are considered at risk if:  You are sexually active and do not regularly use condoms or know the HIV status of your partner(s).  You take drugs by injection.  You are sexually active with a partner who has HIV. Talk with your health care provider about whether you are at high risk of being infected with HIV. If you choose to begin PrEP, you should first be tested for HIV. You should then be tested  every 3 months for as long as you are taking PrEP.  PREGNANCY   If you are premenopausal and you may become pregnant, ask your health care provider about preconception counseling.  If you may become pregnant, take 400 to 800 micrograms (mcg) of folic acid every day.  If you want to prevent pregnancy, talk to your health care provider about birth control (contraception). OSTEOPOROSIS AND MENOPAUSE   Osteoporosis is a disease in which the bones lose minerals and strength with aging. This can result in serious bone fractures. Your risk for osteoporosis can be identified using a bone density scan.  If you are 32 years of age or older, or if you are at risk for osteoporosis and fractures, ask your health care provider if you should be screened.  Ask your health care provider whether you should take a calcium or vitamin D supplement to lower your risk for osteoporosis.  Menopause may have certain physical symptoms and risks.  Hormone replacement therapy may reduce some of these symptoms and risks. Talk to your health care provider about whether hormone replacement therapy is right for you.  HOME CARE INSTRUCTIONS   Schedule regular health, dental, and eye exams.  Stay current with your immunizations.   Do not use any tobacco products including cigarettes, chewing tobacco, or electronic cigarettes.  If you are pregnant, do not drink alcohol.  If you are breastfeeding, limit how much and how often you drink alcohol.  Limit alcohol intake to no more than 1 drink per day for nonpregnant women. One drink equals 12 ounces of beer, 5 ounces of wine, or 1 ounces of hard liquor.  Do not use street drugs.  Do not share needles.  Ask your health care provider for help if you need support or information about quitting drugs.  Tell your health care provider if you often feel depressed.  Tell your health care provider if you have  ever been abused or do not feel safe at home. Document  Released: 11/02/2010 Document Revised: 09/03/2013 Document Reviewed: 03/21/2013 [facility] Patient Information 2015 [place], [place]. This information is not intended to replace advice given to you by your health care provider. Make sure you discuss any questions you have with your health care provider.

## 2014-08-08 NOTE — Assessment & Plan Note (Addendum)
Could not remember to take OCP's each day. [facility] to help control her pain. Pt would like to start depo. Not sexually active within the last year. Pregnancy test negative. First depo shot given today. Counseled on risks of irreg bleeding and also potential weight gain. F/u in 3 mos for nex depo. Also check CBC today due to pt reported hx of anemia. Rx given for ibuprofen 800mg  tabs to use as needed for severe cramping.

## 2014-08-08 NOTE — Progress Notes (Signed)
Patient ID: Amy PeachAyesha N Barrett, female   DOB: 09/25/1990, 24 y.o.   MRN: [id number]  HPI:  Patient presents today for a well woman exam.   Concerns today: dysmenorrhea Periods: very painful periods. Ibuprofen, naproxen, hydrocodone (family member's rx) didn't help. First day is only day it's painful. Has to miss work on first day of each period due to pain. No intermenstrual bleeding. Gets periods monthly. Contraception: not sexually active currently. Previously on pills but did not remember to take each day. Pelvic symptoms: no pelvic pain outside. No itching or odor. Some d/c. Has had some bumps for a week. No hx of herpes or partners with herpes. Sexual activity: no sex in last year STD Screening: would like STD testing today Pap smear status: due today Exercise: none, not interested in exercise Smoking: no Alcohol: occasional Drugs: no Advance directives: full code  ROS: See HPI  PMFSH:  Cancers in family: maternal grandmother breast cancer dx in 1680's Maternal grandfather with prostate cancer Distant cousin with cervical cancer Distant cousin lung cancer  PHYSICAL EXAM: BP 127/63 mmHg  Pulse 73  Temp(Src) 98.1 F (36.7 C) (Oral)  Ht 5\' 7"  (1.702 m)  Wt 142 lb 11.2 oz (64.728 kg)  BMI 22.34 kg/m2  LMP 07/29/2014 (LMP Unknown) Gen: NAD, pleasant, cooperative HEENT: NCAT, PERRL, no palpable thyromegaly or anterior cervical lymphadenopathy Heart: RRR, no murmurs Lungs: CTAB, NWOB Abdomen: soft, nontender to palpation Neuro: grossly nonfocal, speech normal GU: normal appearing external genitalia. Some hair folliculitis from shaving, no vesicles. Vagina is moist with clear discharge. Cervix normal in appearance. No cervical motion tenderness or tenderness on bimanual exam. No adnexal masses.   ASSESSMENT/PLAN:  Health maintenance:  -STD screening: GC/chlamyaida/trich, wet prep, HIV, RPR, hep panel done today -pap smear: done today -lipid screening: CMET and lipid panel  today, has never had lipids checked -advance directives: full code -handout given on health maintenance topics  Primary dysmenorrhea Could not remember to take OCP's each day. [facility]. Pt would like to start depo. Not sexually active within the last year. Pregnancy test negative. First depo shot given today. Counseled on risks of irreg bleeding and also potential weight gain. F/u in 3 mos for nex depo. Also check CBC today due to pt reported hx of anemia.   Note: called pt after visit since wet prep resulted in clue cells with positive whiff. Pt would like treatment. rx sent in for flagyl. Counseled not to drink alcohol with this med.  FOLLOW UP: F/u in 3 months for next depo shot  Amy J. Pollie MeyerMcIntyre, MD [facility]

## 2014-08-09 LAB — HEPATITIS PANEL, ACUTE
HCV AB: NEGATIVE
HEP B C IGM: NONREACTIVE
Hep A IgM: NONREACTIVE
Hepatitis B Surface Ag: NEGATIVE

## 2014-08-09 LAB — HIV ANTIBODY (ROUTINE TESTING W REFLEX): HIV 1&2 Ab, 4th Generation: NONREACTIVE

## 2014-08-09 LAB — RPR

## 2014-08-12 LAB — CYTOLOGY - PAP

## 2014-08-16 ENCOUNTER — Encounter: Payer: Self-pay | Admitting: Family Medicine

## 2014-08-16 ENCOUNTER — Encounter: Payer: 59 | Admitting: Family Medicine

## 2014-10-17 ENCOUNTER — Other Ambulatory Visit (HOSPITAL_COMMUNITY): Admit: 2014-10-17 | Payer: 59

## 2014-10-31 ENCOUNTER — Ambulatory Visit: Payer: 59

## 2014-10-31 ENCOUNTER — Encounter: Payer: Self-pay | Admitting: Family Medicine

## 2014-10-31 ENCOUNTER — Ambulatory Visit (INDEPENDENT_AMBULATORY_CARE_PROVIDER_SITE_OTHER): Payer: 59 | Admitting: Family Medicine

## 2014-10-31 VITALS — BP 123/63 | HR 66 | Temp 98.1°F | Ht 67.0 in | Wt 146.0 lb

## 2014-10-31 DIAGNOSIS — Z309 Encounter for contraceptive management, unspecified: Secondary | ICD-10-CM | POA: Diagnosis not present

## 2014-10-31 DIAGNOSIS — L309 Dermatitis, unspecified: Secondary | ICD-10-CM

## 2014-10-31 MED ORDER — MEDROXYPROGESTERONE ACETATE 150 MG/ML IM SUSP
150.0000 mg | Freq: Once | INTRAMUSCULAR | Status: AC
  Administered 2014-10-31: 150 mg via INTRAMUSCULAR

## 2014-10-31 NOTE — Patient Instructions (Addendum)
It was a pleasure seeing you today in our clinic. Today we discussed skin irritation. Here is the treatment plan we have discussed and agreed upon together:   - continue taking the hydrocortisone; apply at least 3 times daily to the effected sites.  - if you begin to develop increased redness or pain to these sites please stop applying the cream and make another appointment to see Amy Barrett in our office.

## 2014-10-31 NOTE — Progress Notes (Signed)
   HPI  CC: skin rash Patient is here c/o a skin rash which presented 1-2 weeks ago. This rash is localized to 2 individual 3x5cm hyperpigmented oval patches at the medial aspect of the R arm, and the Lt ulnar forearm. She describes these at pruritic and non-painful. She has never had these before. She does endorse a new laundry detergent around the time of sx onset. No other new exposures. Denies significant stress. Denies any allergies of her own. Denies others sharing her home or bed having these sxs. Reports her mother having eczema.   ROS: denies fever, chills, pain, SOB, wheezing, bleeding, recent illnesses.  Past medical history and social history reviewed and updated in the EMR as appropriate.  Objective: BP 123/63 mmHg  Pulse 66  Temp(Src) 98.1 F (36.7 C) (Oral)  Ht 5\' 7"  (1.702 m)  Wt 146 lb (66.225 kg)  BMI 22.86 kg/m2 Gen: NAD, alert, cooperative HEENT: NCAT, EOMI, PERRL CV: RRR, no murmur Resp: CTAB, no wheezes, non-labored Skin:  2 flat 3x5cm hyperpigmented oval patches (1 on R arm, 1 on L forearm), no TTP, some excoriations noted from pruritis; otherwise no other rashes/lesions Ext: No edema, warm Neuro: Alert and oriented, Speech clear, No gross deficits  Assessment and plan:  Eczema Signs and symptoms c/w eczema. DDx includes contact dermatitis--less likely due to absence of rash at waistline or region of bra-straps, but can not rule out at this time. - patient encouraged to purchase OTC hydrocortisone cream 3x/day.  - asked to change back to previous laundry detergent; also to re-wash her clothes that may have been washed w/ the new detergent in order to prevent any continued reactions. - Patient asked to f/u if sxs persist or worsen.    Meds ordered this encounter  Medications  . medroxyPROGESTERone (DEPO-PROVERA) injection 150 mg    Sig:      Kathee DeltonIan D McKeag, MD,MS,  PGY1 11/01/2014 7:46 PM

## 2014-11-01 DIAGNOSIS — L309 Dermatitis, unspecified: Secondary | ICD-10-CM | POA: Insufficient documentation

## 2014-11-01 NOTE — Assessment & Plan Note (Signed)
Signs and symptoms c/w eczema. DDx includes contact dermatitis--less likely due to absence of rash at waistline or region of bra-straps, but can not rule out at this time. - patient encouraged to purchase OTC hydrocortisone cream 3x/day.  - asked to change back to previous laundry detergent; also to re-wash her clothes that may have been washed w/ the new detergent in order to prevent any continued reactions. - Patient asked to f/u if sxs persist or worsen.

## 2015-01-17 ENCOUNTER — Ambulatory Visit (INDEPENDENT_AMBULATORY_CARE_PROVIDER_SITE_OTHER): Payer: 59 | Admitting: *Deleted

## 2015-01-17 DIAGNOSIS — Z3042 Encounter for surveillance of injectable contraceptive: Secondary | ICD-10-CM | POA: Diagnosis not present

## 2015-01-17 MED ORDER — MEDROXYPROGESTERONE ACETATE 150 MG/ML IM SUSP
150.0000 mg | Freq: Once | INTRAMUSCULAR | Status: AC
  Administered 2015-01-17: 150 mg via INTRAMUSCULAR

## 2015-02-12 ENCOUNTER — Telehealth: Payer: Self-pay | Admitting: Family Medicine

## 2015-02-12 NOTE — Telephone Encounter (Signed)
Patient dropped off physical form to be completed. Please call her when ready to be picked up.

## 2015-02-13 NOTE — Telephone Encounter (Signed)
Form placed in PCP box form completion.

## 2015-02-14 NOTE — Telephone Encounter (Signed)
Left voice message for patient that she will need vision, hearting and TB skin test completed before the form is signed by PCP.  Clovis PuMartin, Ricky Gallery L, RN

## 2015-02-14 NOTE — Telephone Encounter (Signed)
She needs hearing, vision and TB before I can sign off and the form. Thanks!

## 2015-02-18 ENCOUNTER — Ambulatory Visit (INDEPENDENT_AMBULATORY_CARE_PROVIDER_SITE_OTHER): Payer: 59 | Admitting: *Deleted

## 2015-02-18 DIAGNOSIS — Z0100 Encounter for examination of eyes and vision without abnormal findings: Secondary | ICD-10-CM | POA: Diagnosis not present

## 2015-02-18 DIAGNOSIS — Z0110 Encounter for examination of ears and hearing without abnormal findings: Secondary | ICD-10-CM

## 2015-02-18 NOTE — Progress Notes (Signed)
   Patient in nurse clinic to complete work physical form.  Vision and hearing screening completed and passed.  Patient also needed a PPD test; however she is a [employer] and had a recent PPD done.  Patient advised to get a copy from [employer].  Clovis PuMartin, Daylin Eads L, RN

## 2015-04-14 ENCOUNTER — Ambulatory Visit (INDEPENDENT_AMBULATORY_CARE_PROVIDER_SITE_OTHER): Payer: 59 | Admitting: *Deleted

## 2015-04-14 DIAGNOSIS — Z3042 Encounter for surveillance of injectable contraceptive: Secondary | ICD-10-CM | POA: Diagnosis not present

## 2015-04-14 MED ORDER — MEDROXYPROGESTERONE ACETATE 150 MG/ML IM SUSP
150.0000 mg | Freq: Once | INTRAMUSCULAR | Status: AC
  Administered 2015-04-14: 150 mg via INTRAMUSCULAR

## 2015-04-14 NOTE — Progress Notes (Signed)
Pt presents to clinic today for next Depo shot.  She is within dates for next depo.  Depo given and pt tolerated well.  No issues. [name], Amy Barrett

## 2015-07-08 ENCOUNTER — Ambulatory Visit (INDEPENDENT_AMBULATORY_CARE_PROVIDER_SITE_OTHER): Payer: 59 | Admitting: *Deleted

## 2015-07-08 ENCOUNTER — Encounter: Payer: Self-pay | Admitting: Family Medicine

## 2015-07-08 ENCOUNTER — Ambulatory Visit (INDEPENDENT_AMBULATORY_CARE_PROVIDER_SITE_OTHER): Payer: 59 | Admitting: Family Medicine

## 2015-07-08 VITALS — BP 123/70 | HR 80 | Ht 67.0 in | Wt 144.0 lb

## 2015-07-08 DIAGNOSIS — Z3042 Encounter for surveillance of injectable contraceptive: Secondary | ICD-10-CM | POA: Diagnosis not present

## 2015-07-08 DIAGNOSIS — R079 Chest pain, unspecified: Secondary | ICD-10-CM | POA: Diagnosis not present

## 2015-07-08 MED ORDER — MEDROXYPROGESTERONE ACETATE 150 MG/ML IM SUSP
150.0000 mg | Freq: Once | INTRAMUSCULAR | Status: AC
  Administered 2015-07-08: 150 mg via INTRAMUSCULAR

## 2015-07-08 NOTE — Progress Notes (Signed)
   Pt in for Depo Provera injection.  Pt tolerated Depo injection. Depo given left upper outer quadrant.  Next injection due May 23-October 07, 2015.  Reminder card given. Merla Sawka L, RN   

## 2015-07-08 NOTE — Assessment & Plan Note (Signed)
Sharp left upper chest pleuritic chest pain without fevers, palpitations, shortness of breath, cough.  No history of cardiac problems.  Multiple similar episodes have occurred intermittently since she was a child that resolved without intervention.  Most likely musculoskeletal etiology versus recording precordial catch syndrome.  Vital signs within normal limits.  No pain currently.  - Discussed return precautions for palpitations, shortness of breath, fevers.  - Provided patient a handout on precordial catch syndrome  - Advised to follow-up with PCP if pain persist past 1 week or develops new or concerning symptoms

## 2015-07-08 NOTE — Progress Notes (Signed)
   Subjective:    Patient ID: Amy Barrett, female    DOB: 19-Dec-1990, 25 y.o.   MRN: [id number]  Seen for Same day visit for   CC: chest pain  She reports sudden onset of sharp left-sided chest pain just below her clavicle.  Pain started this previous weekend and has been occurring intermittently 1-3 times a day for the past week.  Pain lasts only seconds to minutes and resolved completely without intervention.  Nothing seems to cause the pain; however, during the episode deep breaths and movement make the pain worse.  She has tried ibuprofen 1-2 times at the past week that does not seem to help the pain prevent the pain.  She denies any fevers, chills, recent infections.  Denies any coughing or difficulty breathing.  Denies any palpitations or history of heart problems.  She smokes cigars rarely.  Denies any trauma or accidents around the time of the pain. Denies illicit drug use.  She reports multiple similar episodes that have occurred 1-2 times a year since she was child.  Episodes always similar nature and resolve with 1-2 weeks of starting without treatment.   Smoking history noted  Review of Systems   See HPI for ROS. Objective:  BP 123/70 mmHg  Pulse 80  Ht 5\' 7"  (1.702 m)  Wt 144 lb (65.318 kg)  BMI 22.55 kg/m2  General: NAD Cardiac: RRR, normal heart sounds, no murmurs. 2+ radial and PT pulses bilaterally Respiratory: CTAB, normal effort Chest: Mild tenderness to palpation in left upper chest.  Back: Mild muscle spasm and tenderness noted in left upper trapezius Abdomen: soft, nontender, nondistended,  Bowel sounds present Extremities: no edema or cyanosis. WWP. Skin: warm and dry, no rashes noted    Assessment & Plan:   Chest pain Sharp left upper chest pleuritic chest pain without fevers, palpitations, shortness of breath, cough.  No history of cardiac problems.  Multiple similar episodes have occurred intermittently since she was a child that resolved without  intervention.  Most likely musculoskeletal etiology versus recording precordial catch syndrome.  Vital signs within normal limits.  No pain currently.  - Discussed return precautions for palpitations, shortness of breath, fevers.  - Provided patient a handout on precordial catch syndrome  - Advised to follow-up with PCP if pain persist past 1 week or develops new or concerning symptoms

## 2015-08-09 ENCOUNTER — Encounter (HOSPITAL_COMMUNITY): Payer: Self-pay | Admitting: *Deleted

## 2015-08-09 ENCOUNTER — Ambulatory Visit (HOSPITAL_COMMUNITY)
Admission: EM | Admit: 2015-08-09 | Discharge: 2015-08-09 | Disposition: A | Discharge status: Home / Self Care | Payer: 59 | Attending: Family Medicine | Admitting: Family Medicine

## 2015-08-09 DIAGNOSIS — J302 Other seasonal allergic rhinitis: Secondary | ICD-10-CM | POA: Diagnosis not present

## 2015-08-09 MED ORDER — METHYLPREDNISOLONE ACETATE 80 MG/ML IJ SUSP
INTRAMUSCULAR | Status: AC
  Filled 2015-08-09: qty 1

## 2015-08-09 MED ORDER — FLUTICASONE PROPIONATE 50 MCG/ACT NA SUSP: 2.0000 | Freq: Every day | NASAL | Status: DC

## 2015-08-09 MED ORDER — METHYLPREDNISOLONE ACETATE 80 MG/ML IJ SUSP
80.0000 mg | Freq: Once | INTRAMUSCULAR | Status: AC
  Administered 2015-08-09: 80 mg via INTRAMUSCULAR

## 2015-08-09 MED ORDER — CETIRIZINE HCL 10 MG PO TABS: 10.0000 mg | ORAL_TABLET | Freq: Every day | ORAL | Status: DC

## 2015-08-09 NOTE — Discharge Instructions (Signed)
Drink plenty of fluids as discussed, use medicine as prescribed, and mucinex or delsym for cough. Return or see your doctor if further problems °

## 2015-08-09 NOTE — ED Notes (Signed)
Pt  Reports  Symptoms  Of  Cough   /  Congested   As  Well   As   Headache /  Stuffy  Nose   With  Onset  Of  Symptoms     X  5  Days         pt  Reports    Symptoms      Not   releived        By  otc  meds

## 2015-08-09 NOTE — ED Provider Notes (Signed)
CSN: [id number]     Arrival date & time 08/09/15  1300 History   First MD Initiated Contact with Patient 08/09/15 1313     Chief Complaint  Patient presents with  . URI   (Consider location/radiation/quality/duration/timing/severity/associated sxs/prior Treatment) Patient is a 25 y.o. female presenting with URI. The history is provided by the patient.  URI Presenting symptoms: congestion, cough and rhinorrhea   Presenting symptoms: no fever   Severity:  Mild Onset quality:  Gradual Duration:  1 week Timing:  Constant Progression:  Unchanged Chronicity:  New Ineffective treatments:  OTC medications Associated symptoms: sneezing   Associated symptoms: no myalgias, no swollen glands and no wheezing     Past Medical History  Diagnosis Date  . BV (bacterial vaginosis)   . Yeast vaginitis   . Dysmenorrhea since adolescence    US in 2011 that did not show any acute findings. Seen by Dr. Vickey SagesAtkins   No past surgical history on file. Family History  Problem Relation Age of Onset  . Hypertension Mother   . Breast cancer Maternal Grandmother [id number]   Social History  Substance Use Topics  . Smoking status: Current Some Day Smoker    Types: Cigars  . Smokeless tobacco: Never Used  . Alcohol Use: 0.0 oz/week    0 Standard drinks or equivalent per week     Comment: occasional, social   OB History    No data available     Review of Systems  Constitutional: Negative.  Negative for fever.  HENT: Positive for congestion, postnasal drip, rhinorrhea and sneezing.   Respiratory: Positive for cough. Negative for wheezing.   Musculoskeletal: Negative for myalgias.  All other systems reviewed and are negative.   Allergies  Review of patient's allergies indicates no known allergies.  Home Medications   Prior to Admission medications   Medication Sig Start Date End Date Taking? Authorizing Provider  ibuprofen (ADVIL,MOTRIN) 800 MG tablet Take 1 tablet (800 mg total) by mouth every 8  (eight) hours as needed. 08/08/14   Latrelle DodrillBrittany J McIntyre, MD  metroNIDAZOLE (FLAGYL) 500 MG tablet Take 1 tablet (500 mg total) by mouth 2 (two) times daily. For 7 days. Do not drink alcohol while taking this medication. 08/08/14   Latrelle DodrillBrittany J McIntyre, MD   Meds Ordered and Administered this Visit   Medications  methylPREDNISolone acetate (DEPO-MEDROL) injection 80 mg (not administered)    BP 116/75 mmHg  Pulse 74  Temp(Src) 98.6 F (37 C) (Oral)  Resp 18  SpO2 100% No data found.   Physical Exam  Constitutional: She is oriented to person, place, and time. She appears well-developed and well-nourished. No distress.  HENT:  Right Ear: External ear normal.  Left Ear: External ear normal.  Nose: Mucosal edema and rhinorrhea present.  Mouth/Throat: Oropharynx is clear and moist.  Neck: Normal range of motion. Neck supple.  Cardiovascular: Regular rhythm, normal heart sounds and intact distal pulses.   Pulmonary/Chest: Effort normal and breath sounds normal.  Neurological: She is alert and oriented to person, place, and time.  Skin: Skin is warm and dry.  Nursing note and vitals reviewed.   ED Course  Procedures (including critical care time)  Labs Review Labs Reviewed - No data to display  Imaging Review No results found.   Visual Acuity Review  Right Eye Distance:   Left Eye Distance:   Bilateral Distance:    Right Eye Near:   Left Eye Near:    Bilateral Near:  MDM  No diagnosis found. Meds ordered this encounter  Medications  . methylPREDNISolone acetate (DEPO-MEDROL) injection 80 mg    Sig:   . fluticasone (FLONASE) 50 MCG/ACT nasal spray    Sig: Place 2 sprays into both nostrils daily.    Dispense:  1 g    Refill:  2  . cetirizine (ZYRTEC) 10 MG tablet    Sig: Take 1 tablet (10 mg total) by mouth daily. One tab daily for allergies    Dispense:  30 tablet    Refill:  1       Linna Hoff, MD 08/09/15 1328

## 2015-09-26 ENCOUNTER — Ambulatory Visit: Payer: 59

## 2015-09-30 ENCOUNTER — Ambulatory Visit (INDEPENDENT_AMBULATORY_CARE_PROVIDER_SITE_OTHER): Payer: 59 | Admitting: *Deleted

## 2015-09-30 DIAGNOSIS — Z3042 Encounter for surveillance of injectable contraceptive: Secondary | ICD-10-CM

## 2015-09-30 MED ORDER — MEDROXYPROGESTERONE ACETATE 150 MG/ML IM SUSP
150.0000 mg | Freq: Once | INTRAMUSCULAR | Status: AC
  Administered 2015-09-30: 150 mg via INTRAMUSCULAR

## 2015-09-30 NOTE — Progress Notes (Signed)
   Pt in for Depo Provera injection.  Pt tolerated Depo injection. Depo given right upper outer quadrant.  Next injection due August 15-29, 2017.  Reminder card given. Clovis PuMartin, Tamika L, RN

## 2015-12-19 ENCOUNTER — Ambulatory Visit: Payer: 59

## 2015-12-22 ENCOUNTER — Ambulatory Visit (INDEPENDENT_AMBULATORY_CARE_PROVIDER_SITE_OTHER): Payer: 59 | Admitting: *Deleted

## 2015-12-22 DIAGNOSIS — Z3042 Encounter for surveillance of injectable contraceptive: Secondary | ICD-10-CM | POA: Diagnosis not present

## 2015-12-22 MED ORDER — MEDROXYPROGESTERONE ACETATE 150 MG/ML IM SUSP
150.0000 mg | Freq: Once | INTRAMUSCULAR | Status: AC
  Administered 2015-12-22: 150 mg via INTRAMUSCULAR

## 2015-12-22 NOTE — Progress Notes (Signed)
   Pt in for Depo Provera injection.  Pt tolerated Depo injection. Depo given left upper outer quadrant.  Next injection due Nov. 6-20, 2017. Patient is also due to annual exam. Advised patient to call for an appointment.  Reminder card given. Clovis PuMartin, Tamika L, RN

## 2016-03-15 ENCOUNTER — Ambulatory Visit (INDEPENDENT_AMBULATORY_CARE_PROVIDER_SITE_OTHER): Payer: 59 | Admitting: Internal Medicine

## 2016-03-15 ENCOUNTER — Encounter: Payer: Self-pay | Admitting: Internal Medicine

## 2016-03-15 VITALS — BP 130/75 | HR 70 | Temp 98.6°F | Resp 16 | Ht 66.5 in | Wt 139.4 lb

## 2016-03-15 DIAGNOSIS — R5383 Other fatigue: Secondary | ICD-10-CM

## 2016-03-15 DIAGNOSIS — R05 Cough: Secondary | ICD-10-CM | POA: Diagnosis not present

## 2016-03-15 DIAGNOSIS — Z3040 Encounter for surveillance of contraceptives, unspecified: Secondary | ICD-10-CM

## 2016-03-15 DIAGNOSIS — R059 Cough, unspecified: Secondary | ICD-10-CM

## 2016-03-15 DIAGNOSIS — Z3042 Encounter for surveillance of injectable contraceptive: Secondary | ICD-10-CM

## 2016-03-15 LAB — CBC
HEMATOCRIT: 36.4 % (ref 35.0–45.0)
Hemoglobin: 11.9 g/dL (ref 11.7–15.5)
MCH: 29.2 pg (ref 27.0–33.0)
MCHC: 32.7 g/dL (ref 32.0–36.0)
MCV: 89.4 fL (ref 80.0–100.0)
MPV: 9.7 fL (ref 7.5–12.5)
Platelets: 227 10*3/uL (ref 140–400)
RBC: 4.07 MIL/uL (ref 3.80–5.10)
RDW: 13.4 % (ref 11.0–15.0)
WBC: 7.1 10*3/uL (ref 3.8–10.8)

## 2016-03-15 MED ORDER — MEDROXYPROGESTERONE ACETATE 150 MG/ML IM SUSP
150.0000 mg | Freq: Once | INTRAMUSCULAR | Status: AC
  Administered 2016-03-15: 150 mg via INTRAMUSCULAR

## 2016-03-15 NOTE — Patient Instructions (Signed)
Ms. Amy Barrett,  Stress is one of the most common reasons for people to wake up and have trouble falling back asleep. Below are some non-medication tips for trying to fall back asleep.  I will call you with your lab results, testing for anemia and thyroid abnormalities.  Establish a quiet, relaxing bedtime routine. For example, drink a cup of caffeine-free tea, take a warm shower or listen to soft music. Avoid prolonged use of electronic devices with a screen, such as laptops, smartphones and ebooks before bed. Relax your body. Gentle yoga or progressive muscle relaxation can ease tension and help tight muscles to relax. Make your bedroom conducive to sleep. Keep light, noise and the temperature at levels that are comfortable and won't disturb your rest. Don't engage in activities other than sleeping or sex in your bedroom. This will help your body know this room is for sleeping. Put clocks in your bedroom out of sight. Clock-watching causes stress and makes it harder to go back to sleep if you wake up during the night. Avoid caffeine after noon, and limit alcohol to 1 drink several hours before bedtime. Both caffeine and alcohol can interfere with sleep. Avoid smoking. In addition to smoking being a major health risk, nicotine use can interfere with sleep. Get regular exercise. But keep in mind, exercising too close to bedtime may interfere with sleep. Go to bed only when you're sleepy. If you aren't sleepy at bedtime, do something relaxing that will help you wind down. Wake up at the same time every day. If you experience increased awake time during the night, resist the urge to sleep in. Avoid daytime napping. Napping can throw off your sleep cycle. If you wake up and can't fall back to sleep within 20 minutes or so, get out of bed. Go to another room and read or do other quiet activities until you feel sleepy.  Best, Dr. Sampson GoonFitzgerald

## 2016-03-15 NOTE — Progress Notes (Signed)
[facility] Progress Note  Subjective:  Amy Barrett is a 25 y.o. female who presents for check-up. She has concerns of cough x 1 week, weight loss, and increased fatigue. She is also due for a depo shot.   Cough: - Started last week. No sore throat. 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 - Sleeps well during week when she is on a day schedule but sleeps only about 5 hours when she switches to night shift - Has tried melatonin and benadryl without improvement and has made her groggy - Saw a counselor for depressed mood last year but says did not help very much; does not think she is having trouble with her mood but does get irritable after night shifts - Could not identify any particular stressors and denies waking up to racing thoughts - Drinks caffeine but not near bedtime but during night shift ROS: decreased appetite, always cold; no dry skin  Depo: - Started April 2016, as had trouble remembering to take OCPs and had bad cramping with periods - Has not been sexually active in several months - Does not have a period  Had flu vaccine for work at [employer] in October.   Social: Smokes when she drinks. Has maybe 5 cigarettes a week. Uses [employer] & Milds. Drinks about 2-4 times a month, 1 or 2 drinks on an occusion. Does not use illicit drugs.  No Known Allergies  Objective: Blood pressure 130/75, pulse 70, temperature 98.6 F (37 C), temperature source Oral, resp. rate 16, height 5' 6.5" (1.689 m), weight 139 lb 6.4 oz (63.2 kg), SpO2 99 %. Body mass index is 22.16 kg/m. Constitutional: Well-appearing female, in  NAD HENT: Normal posterior oropharynx. Mild erythema and swelling of nasal turbinates. No enlarged thyroid. Cardiovascular: RRR, S1, S2, no m/r/g.  Pulmonary/Chest: Effort normal and breath sounds normal. No respiratory distress.  Abdominal: Soft. +BS, NT, ND, no rebound or guarding.  Musculoskeletal: Breast exam normal (patient requested).  Neurological: AOx3, no focal deficits. Skin: Skin is warm and dry. No rash noted. No erythema.  Psychiatric: Somewhat flat affect.  Vitals reviewed  [facility]/15/2016  Decreased Interest 0  Down, Depressed, Hopeless 1  PHQ - 2 Score 1  Altered sleeping 1  Tired, decreased energy 3  Change in appetite 1  Feeling bad or failure about yourself  0  Trouble concentrating 0  Moving slowly or fidgety/restless 0  Suicidal thoughts 0  PHQ-9 Score 6   Assessment/Plan: Cough - Suspect 2/2 allergic rhinitis. Recommended nasal spray but patient does not like feeling of drops running down throat. Suggested afrin for up to 3 days at a time while symptomatic.   Fatigue - Mostly likely due to shift work. Does score positive for mild depression on PHQ-9 but not interested in counseling at this time. - Recommended trying to coordinate her schedules differently - Provided suggestions on how to maintain a good sleep environment (e.g., limit caffeine before bed, cut back on smoking) - Will obtain TSH and CBC to rule-out thyroid disorder or anemia - Offered trazodone  but patient not interested at this time  Depo-Provera contraceptive status - Administer depo shot  Dani GobbleHillary Fitzgerald, MD [facility], PGY-2

## 2016-03-16 LAB — TSH: TSH: 1.1 mIU/L

## 2016-03-17 DIAGNOSIS — R05 Cough: Secondary | ICD-10-CM | POA: Insufficient documentation

## 2016-03-17 DIAGNOSIS — Z3042 Encounter for surveillance of injectable contraceptive: Secondary | ICD-10-CM | POA: Insufficient documentation

## 2016-03-17 DIAGNOSIS — R059 Cough, unspecified: Secondary | ICD-10-CM | POA: Insufficient documentation

## 2016-03-17 DIAGNOSIS — R5383 Other fatigue: Secondary | ICD-10-CM | POA: Insufficient documentation

## 2016-03-17 NOTE — Assessment & Plan Note (Addendum)
-   Suspect 2/2 allergic rhinitis. Recommended nasal spray but patient does not like feeling of drops running down throat. Suggested afrin for up to 3 days at a time while symptomatic.

## 2016-03-17 NOTE — Assessment & Plan Note (Signed)
-   Mostly likely due to shift work. Does score positive for mild depression on PHQ-9 but not interested in counseling at this time. - Recommended trying to coordinate her schedules differently - Provided suggestions on how to maintain a good sleep environment (e.g., limit caffeine before bed, cut back on smoking) - Will obtain TSH and CBC to rule-out thyroid disorder or anemia - Offered trazodone but patient not interested at this time

## 2016-03-17 NOTE — Assessment & Plan Note (Signed)
-   Administer depo shot

## 2016-06-01 ENCOUNTER — Ambulatory Visit (INDEPENDENT_AMBULATORY_CARE_PROVIDER_SITE_OTHER): Payer: 59 | Admitting: *Deleted

## 2016-06-01 DIAGNOSIS — Z3042 Encounter for surveillance of injectable contraceptive: Secondary | ICD-10-CM

## 2016-06-01 DIAGNOSIS — Z3040 Encounter for surveillance of contraceptives, unspecified: Secondary | ICD-10-CM | POA: Diagnosis not present

## 2016-06-01 MED ORDER — MEDROXYPROGESTERONE ACETATE 150 MG/ML IM SUSP
150.0000 mg | Freq: Once | INTRAMUSCULAR | Status: AC
  Administered 2016-06-01: 150 mg via INTRAMUSCULAR

## 2016-06-01 NOTE — Progress Notes (Signed)
   Pt in for Depo Provera injection.  Pt tolerated Depo injection. Depo given left upper outer quadrant.  Next injection due April 17-Aug 31, 2016.  Reminder card given. Clovis PuMartin, Tamika L, RN

## 2016-08-10 ENCOUNTER — Ambulatory Visit: Payer: 59 | Admitting: Internal Medicine

## 2016-08-17 ENCOUNTER — Encounter: Payer: Self-pay | Admitting: Internal Medicine

## 2016-08-17 ENCOUNTER — Ambulatory Visit (INDEPENDENT_AMBULATORY_CARE_PROVIDER_SITE_OTHER): Payer: 59 | Admitting: Internal Medicine

## 2016-08-17 VITALS — BP 102/72 | HR 73 | Temp 98.2°F | Wt 140.0 lb

## 2016-08-17 DIAGNOSIS — R634 Abnormal weight loss: Secondary | ICD-10-CM

## 2016-08-17 DIAGNOSIS — Z3042 Encounter for surveillance of injectable contraceptive: Secondary | ICD-10-CM | POA: Diagnosis not present

## 2016-08-17 DIAGNOSIS — Z309 Encounter for contraceptive management, unspecified: Secondary | ICD-10-CM

## 2016-08-17 MED ORDER — MEDROXYPROGESTERONE ACETATE 150 MG/ML IM SUSP: 150.0000 mg | Freq: Once | INTRAMUSCULAR | Status: DC

## 2016-08-17 MED ORDER — MEDROXYPROGESTERONE ACETATE 150 MG/ML IM SUSY
150.0000 mg | PREFILLED_SYRINGE | Freq: Once | INTRAMUSCULAR | Status: AC
  Administered 2016-08-17: 150 mg via INTRAMUSCULAR

## 2016-08-17 NOTE — Patient Instructions (Signed)
Ms. Shorb,  I recommend increasing calories and how often you eat to get weight back into the 150s.  Please return in 3 months for your next depo shot.  Best, Dr. Sampson Goon

## 2016-08-17 NOTE — Assessment & Plan Note (Signed)
-   Suspect secondary to decreased caloric intake with only 2 meals a day. Still with healthy BMI but patient feels more attractive at higher weight.  - Counseled patient to try to have 3 meals a day and 1-2 snacks - Provided handout on calorie dense foods as snack options - Could try meal replacement shakes like Ensure or Premier Protein if needs nutrition on the go instead of going without

## 2016-08-17 NOTE — Assessment & Plan Note (Signed)
-   Controlling patient's dysmenorrhea well. - To continue as birth control method for now but to consider changing once decides to start a family/coming up against 5 years of use given bone mineral density concerns - Administered today

## 2016-08-17 NOTE — Progress Notes (Signed)
[facility] Progress Note  Subjective:  Amy Barrett is a 26 y.o. female with history of dysmenorrhea. She presents for depo shot and to discuss weight loss.  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  - Has occurred over last couple of months and not trying to lose weight - Works 2 jobs and no longer eats regular meals due to erratic schedule - Usually has 2 meals a day -- bagel with cream cheese and banana or some granola and yogurt or eats out; does not snack; lunch is the meal she misses most often - Decreased appetite ROS: No palpitations, no hair loss, no abdominal pain or nausea, no depressed mood  Of note, patient still having insomnia but not interested in medication at this time. To consider trazodone.   No Known Allergies  Objective: Blood pressure 102/72, pulse 73, temperature 98.2 F (36.8 C), temperature source Oral, weight 140 lb (63.5 kg). Body mass index is 22.26 kg/m. Constitutional: Well-appearing female, in NAD Cardiovascular: RRR, S1, S2, no m/r/g.  Pulmonary/Chest: Effort normal and breath sounds normal. No respiratory distress.  Abdominal: Soft. +BS, NT, ND Psychiatric: Normal mood and affect.  Vitals reviewed  TSH: 1.10 on 03/15/16  Assessment/Plan: Depo-Provera contraceptive status -  Controlling patient's dysmenorrhea well. - To continue as birth control method for now but to consider changing once decides to start a family/coming up against 5 years of use given bone mineral density concerns - Administered today  Loss of weight - Suspect secondary to decreased caloric intake with only 2 meals a day. Still with healthy BMI but patient feels more attractive at higher weight.  - Counseled patient to try to have 3 meals a day and 1-2 snacks - Provided handout on calorie dense foods as snack options - Could try meal replacement shakes like Ensure or Premier Protein if needs nutrition on the go instead of going without  Follow-up in 3 months for next depo shot or in about 1 month if not gaining weight with increased meals.   Dani Gobble, MD [facility], PGY-2

## 2016-09-07 ENCOUNTER — Other Ambulatory Visit: Payer: Self-pay | Admitting: Internal Medicine

## 2016-09-07 MED ORDER — TRAZODONE HCL 50 MG PO TABS: 50.0000 mg | ORAL_TABLET | Freq: Every evening | ORAL | 0 refills | Status: DC | PRN

## 2016-09-07 MED ORDER — CETIRIZINE HCL 10 MG PO TABS: 10.0000 mg | ORAL_TABLET | Freq: Every day | ORAL | 1 refills | Status: DC

## 2016-09-07 NOTE — Telephone Encounter (Signed)
Pt  calling to request refill of:  Name of Medication(s):  Zyrtec and trazadone  Last date of OV:  08-17-16 Pharmacy:  [facility]  Will route refill request to Clinic RN.  Discussed with patient policy to call pharmacy for future refills.  Also, discussed refills may take up to 48 hours to approve or deny.  Markus JarvisEmily C Pittman

## 2016-09-17 MED FILL — traZODone HCL 50 MG TABS: 50 | 30 days supply | Qty: 30 | Fill #0

## 2016-11-02 ENCOUNTER — Telehealth: Payer: Self-pay | Admitting: Internal Medicine

## 2016-11-02 NOTE — Telephone Encounter (Signed)
Pt says the trazadone isnt working. Please advise

## 2016-11-04 ENCOUNTER — Ambulatory Visit (INDEPENDENT_AMBULATORY_CARE_PROVIDER_SITE_OTHER): Payer: 59 | Admitting: *Deleted

## 2016-11-04 DIAGNOSIS — Z3042 Encounter for surveillance of injectable contraceptive: Secondary | ICD-10-CM | POA: Diagnosis not present

## 2016-11-04 MED ORDER — ZOLPIDEM TARTRATE 5 MG PO TABS: 5.0000 mg | ORAL_TABLET | Freq: Every evening | ORAL | 0 refills | Status: DC | PRN

## 2016-11-04 MED ORDER — MEDROXYPROGESTERONE ACETATE 150 MG/ML IM SUSP
150.0000 mg | Freq: Once | INTRAMUSCULAR | Status: AC
  Administered 2016-11-04: 150 mg via INTRAMUSCULAR

## 2016-11-04 MED FILL — ZOLPIDEM TARTRATE 5 MG TAB: 5 | 30 days supply | Qty: 30 | Fill #0

## 2016-11-04 NOTE — Telephone Encounter (Signed)
Patient in nurse clinic today for Depo Provera.  Pt wanted PCP know that the trazodone is not helping with her insomnia. Pt requested a different medication. Advised patient that she might have to schedule another appointment. Will forward to PCP.  Clovis PuMartin, Dorothee Napierkowski L, RN

## 2016-11-04 NOTE — Telephone Encounter (Signed)
Called patient to ask about trazodone. She said she had even tried doubling to 100 mg without relief. Will try low dose ambien for prn use. If not effective, patient to see me back in clinic. Counseled that I would not recommend this for long-term use.  Dani GobbleHillary Eric Morganti, MD [facility], PGY-3

## 2016-11-04 NOTE — Progress Notes (Signed)
   Pt in for Depo Provera injection.  Pt tolerated Depo injection. Depo given left upper outer quadrant.  Next injection due Sept. 20-Oct. 4, 2018.  Reminder card given. Clovis PuMartin, Stefana Lodico L, RN

## 2017-01-24 ENCOUNTER — Encounter: Payer: Self-pay | Admitting: Internal Medicine

## 2017-01-24 ENCOUNTER — Ambulatory Visit (INDEPENDENT_AMBULATORY_CARE_PROVIDER_SITE_OTHER): Payer: 59 | Admitting: Internal Medicine

## 2017-01-24 VITALS — BP 110/60 | HR 62 | Temp 98.2°F | Ht 67.0 in | Wt 135.0 lb

## 2017-01-24 DIAGNOSIS — R634 Abnormal weight loss: Secondary | ICD-10-CM

## 2017-01-24 DIAGNOSIS — Z3042 Encounter for surveillance of injectable contraceptive: Secondary | ICD-10-CM

## 2017-01-24 DIAGNOSIS — G4709 Other insomnia: Secondary | ICD-10-CM | POA: Diagnosis not present

## 2017-01-24 DIAGNOSIS — N944 Primary dysmenorrhea: Secondary | ICD-10-CM | POA: Diagnosis not present

## 2017-01-24 MED ORDER — MEDROXYPROGESTERONE ACETATE 150 MG/ML IM SUSY
150.0000 mg | PREFILLED_SYRINGE | Freq: Once | INTRAMUSCULAR | Status: AC
  Administered 2017-01-24: 150 mg via INTRAMUSCULAR

## 2017-01-24 MED ORDER — MEDROXYPROGESTERONE ACETATE 150 MG/ML IM SUSP: 150.0000 mg | Freq: Once | INTRAMUSCULAR | Status: DC

## 2017-01-24 MED ORDER — CYPROHEPTADINE HCL 4 MG PO TABS: 4.0000 mg | ORAL_TABLET | Freq: Every day | ORAL | 1 refills | Status: DC

## 2017-01-24 NOTE — Patient Instructions (Signed)
Amy Barrett,  I recommend trying cyproheptadine. This is an antihistamine that can also stimulate appetite. Start with 4 mg at "bedtime."   Try meal replacement shakes in between meals. This can be ensure, milkshakes, boost protein drinks.  Please see me back if no improvement.  Best, Dr. Sampson Goon

## 2017-01-26 MED FILL — CYPROHEPTADINE HCL 4 MG TAB: 4 | 30 days supply | Qty: 30 | Fill #0

## 2017-01-27 DIAGNOSIS — G4709 Other insomnia: Secondary | ICD-10-CM | POA: Insufficient documentation

## 2017-01-27 NOTE — Progress Notes (Signed)
[facility] Progress Note  Subjective:  Amy Barrett is a 26 y.o. female who presents with complaints of insomnia and weight loss. Also due for depo injection.  #Insomnia: - Ongoing over the last year. Has trouble staying asleep and has irregular sleep schedule due to shift work. 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  - Only able to sleep about 5 hours after night shifts.  - Trazodone and ambien did not help. Recently tried hydroxyzine, however, which she does think helped. - Does not regularly drink EtOH - May have a cup of coffee once a week ROS: Denies mood concerns  #Weight loss: - Has lost about 20 lbs over the last 2 years and feels unattractive at this weight - Unable to implement meal planning with higher calorie foods discussed at OV this spring because work schedule makes this too difficult per patient - Appetite decreased. Only eats 2 meals a day and often this is fast food. ROS: No abdominal pain, no n/v/d  #Birth control: - Would like to continue depo. Hx of dysmenorrhea.  Social: Former smoker  No Known Allergies  Objective: Blood pressure 110/60, pulse 62, temperature 98.2 F (36.8 C), temperature source Oral, height  (1.702 m), weight 135 lb (61.2 kg), SpO2 99 %. Body mass index is 21.14 kg/m. Constitutional: Trim well-appearing female in NAD HENT: MMM Cardiovascular: RRR, S1, S2, no m/r/g.  Pulmonary/Chest: Effort normal and breath sounds normal. No respiratory distress.  Abdominal: Soft. +BS, NT, ND Neurological: AOx3, no focal deficits. Psychiatric: Becomes tearful when discussing weight loss and appearance.  Vitals reviewed  TSH nml at 1.10 last year 03/15/16  Assessment/Plan: Other insomnia - Difficulty staying asleep after shift work switches between day and night. Unable to get consistent shifts in the foreseeable future. Not consuming caffeine regularly.  - Precepted  with Dr. Leveda Anna. Will have patient try antihistamine cyproheptadine 4 mg QHS, as may have additional benefit of weight gain, which patient desires.  - Asked patient to call if no improvement with cyproheptadine after a couple weeks. Would then prescribe hydroxyzine.   Loss of weight - No warning signs of abdominal pain or early satiety. Suspect insufficient calorie intake. Weight loss less than 10 lbs over 1 year period and patient not eating consistent meals. BMI still within normal range. - Suggested patient add additional calories with convenience beverages like chocolate milk, ensure, carnation instant breakfast between meals for ease. Stressed importance of trying to have 3 meals a day.   Depo administered.   Follow-up prn.  Dani Gobble, MD [facility], PGY-3

## 2017-01-27 NOTE — Assessment & Plan Note (Signed)
-   Difficulty staying asleep after shift work switches between day and night. Unable to get consistent shifts in the foreseeable future. Not consuming caffeine regularly.  - Precepted with Dr. Leveda Anna. Will have patient try antihistamine cyproheptadine 4 mg QHS, as may have additional benefit of weight gain, which patient desires.  - Asked patient to call if no improvement with cyproheptadine after a couple weeks. Would then prescribe hydroxyzine.

## 2017-01-27 NOTE — Assessment & Plan Note (Addendum)
-   No warning signs of abdominal pain or early satiety. Suspect insufficient calorie intake. Weight loss less than 10 lbs over 1 year period and patient not eating consistent meals. BMI still within normal range. - Suggested patient add additional calories with convenience beverages like chocolate milk, ensure, carnation instant breakfast between meals for ease. Stressed importance of trying to have 3 meals a day.

## 2017-02-16 ENCOUNTER — Telehealth: Payer: Self-pay | Admitting: Internal Medicine

## 2017-02-16 NOTE — Telephone Encounter (Signed)
Patient was started on cyproheptadine in Sept. Patient states this is not helping with her insomnia and would like something else prescribed. Please advise.

## 2017-02-17 ENCOUNTER — Other Ambulatory Visit: Payer: Self-pay | Admitting: Internal Medicine

## 2017-02-17 MED ORDER — HYDROXYZINE HCL 10 MG PO TABS: 10.0000 mg | ORAL_TABLET | Freq: Every evening | ORAL | 0 refills | Status: DC | PRN

## 2017-02-17 MED FILL — hydrOXYzine HCL 10 MG TABS: 10 | 30 days supply | Qty: 30 | Fill #0

## 2017-02-17 NOTE — Progress Notes (Signed)
I have prescribed Hydroxyzine per Dr. Jarrett AblesFitzgerald's plan if Cyproheptadine did not help to improve sleep. If patient tries Hydroxyzine for the next couple of weeks without improvement I would recommend follow up with PCP.   Marcy Sirenatherine Wallace, D.O. 02/17/2017, 8:44 AM PGY-3, [facility]

## 2017-02-17 NOTE — Progress Notes (Signed)
Left message on voicemail for patient to call back. 

## 2017-03-04 ENCOUNTER — Telehealth: Payer: Self-pay | Admitting: Internal Medicine

## 2017-03-04 MED ORDER — HYDROXYZINE HCL 25 MG PO TABS: 25.0000 mg | ORAL_TABLET | Freq: Every evening | ORAL | 1 refills | Status: DC | PRN

## 2017-03-04 NOTE — Telephone Encounter (Signed)
Pt called and said her Rx for hydroyzine of 10 mg has not been helping her sleep with just taking 1 pill. Pt started taking 2 pills and it does help her sleep. Pt would like a higher dosage so she only has to take 1 pill. Would like a call back regarding this situation.

## 2017-03-04 NOTE — Telephone Encounter (Signed)
Placed order for 25 mg hydroxyzine tablets. Please let patient know this was [facility] to [facility].  Dani GobbleHillary Emoni Yang, MD [facility], PGY-3

## 2017-03-10 NOTE — Telephone Encounter (Signed)
Pt informed. Reesha Debes, CMA  

## 2017-03-23 MED FILL — hydrOXYzine HCL 25 MG TABS: 25 | 30 days supply | Qty: 30 | Fill #0

## 2017-04-14 ENCOUNTER — Other Ambulatory Visit: Payer: Self-pay

## 2017-04-14 ENCOUNTER — Ambulatory Visit (INDEPENDENT_AMBULATORY_CARE_PROVIDER_SITE_OTHER): Payer: 59 | Admitting: Internal Medicine

## 2017-04-14 ENCOUNTER — Ambulatory Visit: Payer: 59

## 2017-04-14 ENCOUNTER — Encounter: Payer: Self-pay | Admitting: Internal Medicine

## 2017-04-14 VITALS — BP 110/58 | HR 60 | Temp 98.0°F | Ht 67.0 in | Wt 149.0 lb

## 2017-04-14 DIAGNOSIS — Z3042 Encounter for surveillance of injectable contraceptive: Secondary | ICD-10-CM | POA: Diagnosis not present

## 2017-04-14 DIAGNOSIS — N898 Other specified noninflammatory disorders of vagina: Secondary | ICD-10-CM | POA: Diagnosis not present

## 2017-04-14 LAB — POCT WET PREP (WET MOUNT)
CLUE CELLS WET PREP WHIFF POC: NEGATIVE
TRICHOMONAS WET PREP HPF POC: ABSENT

## 2017-04-14 MED ORDER — FLUCONAZOLE 150 MG PO TABS: 150.0000 mg | ORAL_TABLET | Freq: Once | ORAL | 0 refills | Status: AC

## 2017-04-14 MED ORDER — MEDROXYPROGESTERONE ACETATE 150 MG/ML IM SUSY: 150.0000 mg | PREFILLED_SYRINGE | Freq: Once | INTRAMUSCULAR | Status: DC

## 2017-04-14 MED ORDER — MEDROXYPROGESTERONE ACETATE 150 MG/ML IM SUSP
150.0000 mg | Freq: Once | INTRAMUSCULAR | Status: DC
  Administered 2017-04-14: 150 mg via INTRAMUSCULAR

## 2017-04-14 NOTE — Patient Instructions (Signed)
It was so nice to meet you!  I will call you with your lab results.  -Dr. Macintyre Alexa 

## 2017-04-14 NOTE — Progress Notes (Signed)
   [facility] Phone: [phone]  Subjective:  Amy Barrett is a 26 year old female presenting to clinic with vaginal discharge for the last week. The discharge is brown in color. She notes some mild lower abdominal cramping. The has had the discharge monthly over the last couple of months. She also notes vaginal itching and burning. She is not sexually active. She has not had sexual intercourse for 4 years. No dysuria, no fevers. Currently on depo.  ROS: See HPI for pertinent positives and negatives  Past Medical History- eczema  Family history reviewed for today's visit. No changes.  Social history- not currently sexually active.  Objective: BP (!) 110/58 (BP Location: Left Arm, Patient Position: Sitting, Cuff Size: Normal)   Pulse 60   Temp 98 F (36.7 C) (Oral)   Ht 5\' 7"  (1.702 m)   Wt 149 lb (67.6 kg)   SpO2 99%   BMI 23.34 kg/m  Gen: NAD, alert, cooperative with exam GI: No tenderness to palpation, no rebound, no guarding. GU: External genitalia normal in appearance, vaginal walls normal, cervix normal, small amount of white/clear vaginal discharge present, no cervical motion tenderness.  Assessment/Plan: Vaginal Discharge: Occurring monthly and is brown in color. Sounds like spotting. Patient currently on depo, so not having regular periods. Patient also endorsing vaginal itching and burning. Wet prep negative, but will treat empirically for vaginal candidiasis given her symptoms.  - Diflucan 150mg  x 1 - Patient given depo shot today - Patient declined gonorrhea and chlamydia testing given that she has not been sexually active for the last 4 years. - Follow-up if no improvement   Willadean CarolKaty Dimitrious Micciche, MD PGY-3

## 2017-04-15 NOTE — Assessment & Plan Note (Signed)
Occurring monthly and is brown in color. Sounds like spotting. Patient currently on depo, so not having regular periods. Patient also endorsing vaginal itching and burning. Wet prep negative, but will treat empirically for vaginal candidiasis given her symptoms.  - Diflucan 150mg  x 1 - Patient given depo shot today - Patient declined gonorrhea and chlamydia testing given that she has not been sexually active for the last 4 years. - Follow-up if no improvement

## 2017-05-12 ENCOUNTER — Ambulatory Visit (INDEPENDENT_AMBULATORY_CARE_PROVIDER_SITE_OTHER): Payer: No Typology Code available for payment source | Admitting: Internal Medicine

## 2017-05-12 ENCOUNTER — Encounter: Payer: Self-pay | Admitting: Internal Medicine

## 2017-05-12 VITALS — BP 112/75 | HR 75 | Temp 98.2°F | Wt 149.4 lb

## 2017-05-12 DIAGNOSIS — N898 Other specified noninflammatory disorders of vagina: Secondary | ICD-10-CM

## 2017-05-12 DIAGNOSIS — M6740 Ganglion, unspecified site: Secondary | ICD-10-CM | POA: Diagnosis not present

## 2017-05-12 DIAGNOSIS — Z0000 Encounter for general adult medical examination without abnormal findings: Secondary | ICD-10-CM | POA: Diagnosis not present

## 2017-05-12 MED ORDER — METHYLPREDNISOLONE ACETATE 40 MG/ML IJ SUSP
40.0000 mg | Freq: Once | INTRAMUSCULAR | Status: AC
  Administered 2017-05-12: 40 mg via INTRAMUSCULAR

## 2017-05-12 NOTE — Patient Instructions (Addendum)
Ms. Amy Barrett,  I drained your ganglion cyst. If you develop redness or swelling at the site or have fevers, please return, as this could be a sign of infection. Keep covered with a bandaid.  Continue trying to eat regular meals.   You will get a call from Gynecology to make an appointment.  Best, Dr. Sampson GoonFitzgerald

## 2017-05-12 NOTE — Progress Notes (Signed)
[facility] Progress Note  Subjective:  Amy Barrett is a 27 y.o. female with history of eczema and insomnia in setting of shift work who presents because "needs a physical for new insurance at work."   Concerns:  - ganglion cyst on her L wrist bothering her. Had it drained a couple years ago. Does not bother her often. Does not recall any recent injury to the area. Is not limiting her ability to complete daily tasks. No fevers or chills. Interested in trying to drain again today.  - wants to see gynecology due to concern of vaginal discharge because "was not satisfied" with negative wet prep and explanation at previous visit. Does not douche. Former smoker. No recent sexual activity. On depo for dysmenorrhea with last injection in December 2018.   - Reports has been able to improve appetite by having more regular meals, drinking milkshakes. - Insomnia somewhat improved since stopping part-time job. Now goes to sleep around 2 am and sleeps until 9-10 am.  Social: Drinks at most 2 drinks a week on weekend. Does not use drugs. Quit smoking as New Years resolution.  HM: Due for pap in April 2019.   No Known Allergies  Social History   Tobacco Use  . Smoking status: Former Smoker    Types: Cigars  . Smokeless tobacco: Current User  . Tobacco comment: Black and miles  Substance Use Topics  . Alcohol use: Yes    Alcohol/week: 0.0 oz    Comment: occasional, social    Objective: Blood pressure 112/75, pulse 75, temperature 98.2 F (36.8 C), temperature source Oral, weight 149 lb 6.4 oz (67.8 kg), SpO2 100 %. Body mass index is 23.4 kg/m. Constitutional: Well-appearing female in NAD HENT: MMM, mild nasal congestion, normal TMs bilaterally Cardiovascular: RRR, S1, S2, no m/r/g.  Pulmonary/Chest: Effort normal and breath sounds normal.  Abdominal: Soft. +BS, NT Musculoskeletal: FROM Neurological: AOx3, no focal deficits. 1+ patellar reflexes bilaterally.  Skin:  Skin is warm and dry. No rash noted. ~1.5 cm round protrusion at dorsal surface of L wrist, becomes more prominent with wrist flexion. Minimally TTP.  Psychiatric: Normal mood and affect.  Vitals reviewed  Assessment/Plan: Ganglion cyst drainage: A timeout protocol was performed prior to initiating the procedure. The area was prepared and draped in the usual, sterile manner with betadine and alcohol. The site was anesthetized with 0.5 ml of 1% lidocaine. Less than 0.25 cc of methylprednisolone 40 mg/cc also injected. Attempted aspiration with 23 g needle but no fluid could be removed. Re-attempted with 18 g needle. No fluid aspirated. However, felt entry through capsule and cyst no longer protuberant.   The patient tolerated the procedure well without complications.  Standard post-procedure care is explained and return precautions are given. Ice as needed.   Ganglion cyst - Attempted drainage but could not aspirate any fluid. However, with puncture of capsule, cyst no longer protuberant. No pain after procedure. - Advised patient fluid likely to re-accumulate. Would refer to Hand Surgery for definitive treatment. She declines referral at this time.   Vaginal discharge - Placed referral to Gynecology per patient preference.   Follow-up prn.  Dani GobbleHillary Fitzgerald, MD [facility], PGY-3

## 2017-05-14 ENCOUNTER — Encounter: Payer: Self-pay | Admitting: Internal Medicine

## 2017-05-14 DIAGNOSIS — M6740 Ganglion, unspecified site: Secondary | ICD-10-CM | POA: Insufficient documentation

## 2017-05-14 NOTE — Assessment & Plan Note (Signed)
-   Placed referral to Gynecology per patient preference.

## 2017-05-14 NOTE — Assessment & Plan Note (Signed)
-   Attempted drainage but could not aspirate any fluid. However, with puncture of capsule, cyst no longer protuberant. No pain after procedure. - Advised patient fluid likely to re-accumulate. Would refer to Hand Surgery for definitive treatment. She declines referral at this time.

## 2017-05-27 MED FILL — hydrOXYzine HCL 25 MG TABS: 25 | 30 days supply | Qty: 30 | Fill #1

## 2017-07-12 ENCOUNTER — Encounter: Payer: Self-pay | Admitting: Women's Health

## 2017-07-12 ENCOUNTER — Ambulatory Visit (INDEPENDENT_AMBULATORY_CARE_PROVIDER_SITE_OTHER): Payer: No Typology Code available for payment source | Admitting: Women's Health

## 2017-07-12 VITALS — BP 130/80 | Ht 67.0 in | Wt 154.0 lb

## 2017-07-12 DIAGNOSIS — N898 Other specified noninflammatory disorders of vagina: Secondary | ICD-10-CM | POA: Diagnosis not present

## 2017-07-12 DIAGNOSIS — Z30013 Encounter for initial prescription of injectable contraceptive: Secondary | ICD-10-CM | POA: Diagnosis not present

## 2017-07-12 DIAGNOSIS — Z01419 Encounter for gynecological examination (general) (routine) without abnormal findings: Secondary | ICD-10-CM

## 2017-07-12 MED ORDER — MEDROXYPROGESTERONE ACETATE 150 MG/ML IM SUSP: 150.0000 mg | INTRAMUSCULAR | 4 refills | Status: DC

## 2017-07-12 MED ORDER — MEDROXYPROGESTERONE ACETATE 150 MG/ML IM SUSP
150.0000 mg | Freq: Once | INTRAMUSCULAR | Status: AC
  Administered 2017-07-12: 150 mg via INTRAMUSCULAR

## 2017-07-12 NOTE — Addendum Note (Signed)
Addended by: Tito DineBONHAM, KIM A on: 07/12/2017 02:32 PM   Modules accepted: Orders

## 2017-07-12 NOTE — Addendum Note (Signed)
Addended by: Tito DineBONHAM, KIM A on: 07/12/2017 02:44 PM   Modules accepted: Orders

## 2017-07-12 NOTE — Addendum Note (Signed)
Addended by: Berna SpareASTILLO, BLANCA A on: 07/12/2017 02:03 PM   Modules accepted: Orders

## 2017-07-12 NOTE — Progress Notes (Signed)
Glorious Peachyesha N Berkery [birth year] [id number]    History:    Presents for annual exam and depo shot. Reported a mild itching, irritation , and discharge on the vaginal and anal area that is resolving. Not sexually active for the past 4 years, denies need for STD screen. Depo-Provera for greater than 2 years/amenorrhea. Normal pap smear history. Did not received Gardasil.  Past medical history, past surgical history, family history and social history were all reviewed and documented in the EPIC chart. [employer] Employee works in [employer].  ROS:  A ROS was performed and pertinent positives and negatives are included.  Exam:  Vitals:   07/12/17 1221  BP: 130/80  Weight: 154 lb (69.9 kg)  Height: 5\' 7"  (1.702 m)   Body mass index is 24.12 kg/m.   General appearance:  Normal Thyroid:  Symmetrical, normal in size, without palpable masses or nodularity. Respiratory  Auscultation:  Clear without wheezing or rhonchi Cardiovascular  Auscultation:  Regular rate, without rubs, murmurs or gallops  Edema/varicosities:  Not grossly evident Abdominal  Soft,nontender, without masses, guarding or rebound.  Liver/spleen:  No organomegaly noted  Hernia:  None appreciated  Skin  Inspection:  Grossly normal   Breasts: Examined lying and sitting.     Right: Without masses, retractions, discharge or axillary adenopathy.     Left: Without masses, retractions, discharge or axillary adenopathy. Gentitourinary   Inguinal/mons:  Normal without inguinal adenopathy  External genitalia:  Normal  BUS/Urethra/Skene's glands:  Normal  Vagina:  Normal no visible erythema or discharge  Cervix:  Normal  Uterus:  normal in size, shape and contour.  Midline and mobile  Adnexa/parametria:     Rt: Without masses or tenderness.   Lt: Without masses or tenderness.  Anus and perineum: Normal    Assessment/Plan:  27 y.o. SBF G0 for annual exam/depo shot.   Depo-Provera/dysmenorrhea with  relief/Amenorrhea labs-primary care  Plan: contraception options discussed, will continue Depo Provera. Administered depo-provera, aware next injection is due in 12 weeks. SBE's, exercise, diet, multivitamin, and folic acid. Instructed to apply A+D cream on the vaginal area. Loose clothing, open to air as able.. Call if continued problems. pap    Harrington Challengerancy J Young [address] [employer] [address] [employer]NP, 1:12 PM 07/12/2017

## 2017-07-12 NOTE — Patient Instructions (Signed)

## 2017-07-14 LAB — PAP IG W/ RFLX HPV ASCU

## 2017-07-15 MED FILL — hydrOXYzine HCL 25 MG TABS: 25 | 30 days supply | Qty: 30 | Fill #2

## 2017-07-17 ENCOUNTER — Ambulatory Visit (INDEPENDENT_AMBULATORY_CARE_PROVIDER_SITE_OTHER): Payer: Self-pay | Admitting: Family Medicine

## 2017-07-17 ENCOUNTER — Encounter: Payer: Self-pay | Admitting: Family Medicine

## 2017-07-17 VITALS — BP 118/76 | HR 72 | Temp 98.4°F | Resp 18 | Wt 153.0 lb

## 2017-07-17 DIAGNOSIS — K047 Periapical abscess without sinus: Secondary | ICD-10-CM

## 2017-07-17 DIAGNOSIS — J029 Acute pharyngitis, unspecified: Secondary | ICD-10-CM

## 2017-07-17 LAB — POCT RAPID STREP A (OFFICE): Rapid Strep A Screen: NEGATIVE

## 2017-07-17 MED ORDER — AMOXICILLIN 875 MG PO TABS: 875.0000 mg | ORAL_TABLET | Freq: Two times a day (BID) | ORAL | 0 refills | Status: DC

## 2017-07-17 NOTE — Patient Instructions (Addendum)
Dental Abscess A dental abscess is pus in or around a tooth. Follow these instructions at home:  Take medicines only as told by your dentist.  If you were prescribed antibiotic medicine, finish all of it even if you start to feel better.  Rinse your mouth (gargle) often with salt water.  Do not drive or use heavy machinery, like a lawn mower, while taking pain medicine.  Do not apply heat to the outside of your mouth.  Keep all follow-up visits as told by your dentist. This is important. Contact a doctor if:  Your pain is worse, and medicine does not help. Get help right away if:  You have a fever or chills.  Your symptoms suddenly get worse.  You have a very bad headache.  You have problems breathing or swallowing.  You have trouble opening your mouth.  You have puffiness (swelling) in your neck or around your eye. This information is not intended to replace advice given to you by your health care provider. Make sure you discuss any questions you have with your health care provider. Document Released: 09/03/2014 Document Revised: 09/25/2015 Document Reviewed: 04/16/2014 Elsevier Interactive Patient Education  2018 [employer] Sore Throat When you have a sore throat, your throat may:  Hurt.  Burn.  Feel irritated.  Feel scratchy.  Many things can cause a sore throat, including:  An infection.  Allergies.  Dryness in the air.  Smoke or pollution.  Gastroesophageal reflux disease (GERD).  A tumor.  A sore throat can be the first sign of another sickness. It can happen with other problems, like coughing or a fever. Most sore throats go away without treatment. Follow these instructions at home:  Take over-the-counter medicines only as told by your doctor.  Drink enough fluids to keep your pee (urine) clear or pale yellow.  Rest when you feel you need to.  To help with pain, try: ? Sipping warm liquids, such as broth, herbal tea, or warm  water. ? Eating or drinking cold or frozen liquids, such as frozen ice pops. ? Gargling with a salt-water mixture 3-4 times a day or as needed. To make a salt-water mixture, add -1 tsp of salt in 1 cup of warm water. Mix it until you cannot see the salt anymore. ? Sucking on hard candy or throat lozenges. ? Putting a cool-mist humidifier in your bedroom at night. ? Sitting in the bathroom with the door closed for 5-10 minutes while you run hot water in the shower.  Do not use any tobacco products, such as cigarettes, chewing tobacco, and e-cigarettes. If you need help quitting, ask your doctor. Contact a doctor if:  You have a fever for more than 2-3 days.  You keep having symptoms for more than 2-3 days.  Your throat does not get better in 7 days.  You have a fever and your symptoms suddenly get worse. Get help right away if:  You have trouble breathing.  You cannot swallow fluids, soft foods, or your saliva.  You have swelling in your throat or neck that gets worse.  You keep feeling like you are going to throw up (vomit).  You keep throwing up. This information is not intended to replace advice given to you by your health care provider. Make sure you discuss any questions you have with your health care provider. Document Released: 01/27/2008 Document Revised: 12/14/2015 Document Reviewed: 02/07/2015 Elsevier Interactive Patient Education  [employer]2018 [employer]

## 2017-07-17 NOTE — Progress Notes (Signed)
Amy Barrett is a 27 y.o. female who presents with 3 days of sore throat and right sided facial swelling. She reports that she has experienced these symptoms for the last 3 days. She has attempted to self treat with warm salt water rinses, oragel topical anesthetic, and OTC motrin for pain. She is concerned about the persistence of symptoms and the pain. Patient reports that she is a smoker and has been advised in the past by her dentist that her wisdom teeth need to be removed.  Review of Systems  Constitutional: Positive for chills and fever.  HENT: Positive for sore throat.   Eyes: Negative.   Respiratory: Negative for cough, sputum production and shortness of breath.   Cardiovascular: Negative.   Gastrointestinal: Negative.   Genitourinary: Negative.   Musculoskeletal: Negative.   Skin: Negative.   Neurological: Negative.   Endo/Heme/Allergies: Negative.   Psychiatric/Behavioral: Negative.     O: Vitals:   07/17/17 1437  BP: 118/76  Pulse: 72  Resp: 18  Temp: 98.4 F (36.9 C)  SpO2: 100%   Physical Exam  Constitutional: She is oriented to person, place, and time. She appears well-developed and well-nourished. She is active.  HENT:  Head: Normocephalic.  Right Ear: Hearing, tympanic membrane, external ear and ear canal normal.  Left Ear: Hearing, tympanic membrane, external ear and ear canal normal.  Nose: Rhinorrhea present.  Mouth/Throat: Uvula is midline and mucous membranes are normal. Oropharyngeal exudate, posterior oropharyngeal edema and posterior oropharyngeal erythema present. No tonsillar abscesses.  Neck: Normal range of motion. Neck supple.  Cardiovascular: Normal rate, regular rhythm, normal heart sounds and intact distal pulses.  Pulmonary/Chest: Effort normal and breath sounds normal.  Abdominal: Soft. Bowel sounds are normal.  Musculoskeletal: Normal range of motion.  Lymphadenopathy:    She has cervical adenopathy.  Neurological: She is alert and  oriented to person, place, and time.  Skin: Skin is warm.     A: 1. Sore throat   2. Tooth abscess    P: 1. Sore throat - POCT rapid strep A- NEGATIVE  2. Tooth abscess - amoxicillin (AMOXIL) 875 MG tablet; Take 1 tablet (875 mg total) by mouth 2 (two) times daily.

## 2017-07-18 MED FILL — AMOXICILLIN 875 MG TABLET: 875 | 10 days supply | Qty: 20 | Fill #0

## 2017-07-20 ENCOUNTER — Telehealth: Payer: Self-pay

## 2017-07-21 ENCOUNTER — Telehealth: Payer: Self-pay | Admitting: Internal Medicine

## 2017-07-21 NOTE — Telephone Encounter (Signed)
Pt  Would like to talk to dr.  [facility].  She is on an antibotic.  Her abcess is on her right tonsil and it is red.  She is very concerned about the tonsil. Please advise

## 2017-07-22 NOTE — Telephone Encounter (Signed)
Pt is calling again wanting to speak to MD about her throat. Her call back [phone] Shawna OrleansMeredith B Thomsen, RN

## 2017-07-22 NOTE — Telephone Encounter (Signed)
Pt called again really worried about this abscess. She is worried because nobody is calling her back. Please advise.

## 2017-07-22 NOTE — Telephone Encounter (Signed)
Patient continues to call as she is concerned about her tonsil. Please call patient and discuss or would you like for patient to make an appointment?

## 2017-07-22 NOTE — Telephone Encounter (Signed)
Covering inbox for Dr. Sampson GoonFitzgerald  Returned patient's call. She states there is a spot on her right tonsil she is worried about. She is on Amoxicillin for a tooth abscess since Sunday and notes that it is getting better. She had had no fevers, chills, nausea, vomiting, throat pain, dyspnea, or dysphagia.   Discussed with patient I am not sure what is on her tonsil without looking. I offered her an appointment on Monday, she notes that she will see how she feels over the weekend and then call. Discussed red flag symptoms and reasons to go to the ED. She expressed good understanding and was appreciative of the call.   Beaulah Dinninghristina M Ardean Melroy, MD PGY-3, [facility]

## 2017-07-25 ENCOUNTER — Ambulatory Visit: Payer: No Typology Code available for payment source

## 2017-07-26 ENCOUNTER — Other Ambulatory Visit: Payer: Self-pay

## 2017-07-26 ENCOUNTER — Other Ambulatory Visit: Payer: Self-pay | Admitting: Nurse Practitioner

## 2017-07-26 ENCOUNTER — Ambulatory Visit (INDEPENDENT_AMBULATORY_CARE_PROVIDER_SITE_OTHER): Payer: No Typology Code available for payment source | Admitting: Internal Medicine

## 2017-07-26 ENCOUNTER — Encounter: Payer: Self-pay | Admitting: Internal Medicine

## 2017-07-26 VITALS — BP 140/80 | HR 79 | Temp 98.3°F | Ht 67.0 in | Wt 153.4 lb

## 2017-07-26 DIAGNOSIS — J358 Other chronic diseases of tonsils and adenoids: Secondary | ICD-10-CM | POA: Diagnosis not present

## 2017-07-26 DIAGNOSIS — K029 Dental caries, unspecified: Secondary | ICD-10-CM

## 2017-07-26 MED ORDER — FLUCONAZOLE 150 MG PO TABS: 150.0000 mg | ORAL_TABLET | Freq: Once | ORAL | 0 refills | Status: AC

## 2017-07-26 MED FILL — FLUCONAZOLE 150 MG TABLET: 150 | 9 days supply | Qty: 3 | Fill #0

## 2017-07-26 NOTE — Progress Notes (Signed)
Patient called stating she was treated with an antibiotic on 3/17 and has since developed a yeast infection.  Requesting prescription for Diflucan.  [facility] per patient request.

## 2017-07-26 NOTE — Patient Instructions (Signed)
Finish out your antibiotics.  Follow-up in 1 month to check on the red nodule.  Make sure to follow up with a dentist

## 2017-07-26 NOTE — Progress Notes (Signed)
   [facility] Noralee CharsAsiyah Bentzion Dauria, MD Phone: [phone]  Reason For Visit: SDA for tonsil nodule   # Tonsil Nodule  Patient states that about 2 weeks ago she noticed a nodule on her tonsil.  She was also having some gum pain at that time.  He went to an urgent care and received amoxicillin for her dental issues.  Urgent care doctor was not able to tell her what the nodule on her tonsil was.  She had had a sore throat earlier in the week but was negative for strep.  She has since had resolution of the dental pain on the Augmentin and denies any sore throat or pain associated with the tonsillar nodule.  She denies any fevers chills nausea or vomiting.  Past Medical History Reviewed problem list.  Medications- reviewed and updated No additions to family history Social history- patient is a non-smoker  Objective: BP 140/80   Pulse 79   Temp 98.3 F (36.8 C) (Oral)   Ht 5\' 7"  (1.702 m)   Wt 153 lb 6.4 oz (69.6 kg)   SpO2 99%   BMI 24.03 kg/m  Gen: NAD, alert, cooperative with exam HEENT: Normal    Neck: No masses palpated. No lymphadenopathy    Ears: Tympanic membranes intact, normal light reflex, no erythema, no bulging    Eyes: PERRLA, EOMI    Nose: nasal turbinates moist    Throat: small red  hyperplastic polyp on the right tonsil Cardio: regular rate and rhythm, S1S2 heard, no murmurs appreciated Skin: dry, intact, no rashes or lesions   Assessment/Plan: See problem based a/p  Tonsillar tag Notable for a posterior pharyngeal lymphoid hyperplasia, thrombosed Discussed with Dr. Jennette KettleNeal  Provided reassurance, should resolve on its  Follow up in 1 month   Dental cavity Dental cavity placed on augmentin by urgent care  Encouraged patient to finish up regiment

## 2017-07-27 ENCOUNTER — Encounter: Payer: Self-pay | Admitting: Internal Medicine

## 2017-07-27 DIAGNOSIS — K029 Dental caries, unspecified: Secondary | ICD-10-CM | POA: Insufficient documentation

## 2017-07-27 DIAGNOSIS — J358 Other chronic diseases of tonsils and adenoids: Secondary | ICD-10-CM | POA: Insufficient documentation

## 2017-07-27 NOTE — Assessment & Plan Note (Signed)
Dental cavity placed on augmentin by urgent care  Encouraged patient to finish up regiment

## 2017-07-27 NOTE — Assessment & Plan Note (Signed)
Notable for a posterior pharyngeal lymphoid hyperplasia, thrombosed Discussed with Dr. Jennette Kettle  Provided reassurance, should resolve on its  Follow up in 1 month

## 2017-08-09 ENCOUNTER — Other Ambulatory Visit: Payer: Self-pay | Admitting: Internal Medicine

## 2017-08-09 ENCOUNTER — Telehealth: Payer: Self-pay | Admitting: Internal Medicine

## 2017-08-09 MED ORDER — HYDROXYZINE HCL 50 MG PO TABS: 50.0000 mg | ORAL_TABLET | Freq: Every evening | ORAL | 3 refills | Status: DC | PRN

## 2017-08-09 NOTE — Telephone Encounter (Signed)
Spoke with patient about discoloration of skin. I let her know I do think this is a reaction from the steroid. I let her know it can take 6 months to 1 year for skin to start returning to regular coloration. She said fluid has not re-accumulated and wrist does feel better. She also requested refill of hydroxyzine. This was placed.

## 2017-08-09 NOTE — Telephone Encounter (Signed)
Pt would like Fitzgerald to call her back concerning her skin area around the place where she had her cyst drained. She said the area around where it was has no pigment to it now. Please advise

## 2017-08-30 MED FILL — hydrOXYzine HCL 50 MG TABS: 50 | 30 days supply | Qty: 30 | Fill #0

## 2017-09-21 ENCOUNTER — Telehealth: Payer: Self-pay | Admitting: Internal Medicine

## 2017-09-21 MED ORDER — HYDROXYZINE HCL 50 MG PO TABS: 50.0000 mg | ORAL_TABLET | Freq: Every evening | ORAL | 3 refills | Status: DC | PRN

## 2017-09-21 MED ORDER — FEXOFENADINE HCL 180 MG PO TABS: 180.0000 mg | ORAL_TABLET | Freq: Every day | ORAL | 2 refills | Status: DC | PRN

## 2017-09-21 NOTE — Telephone Encounter (Signed)
Pt would like another allergy medicine. The zyrtec is not working.  [facility]

## 2017-09-21 NOTE — Telephone Encounter (Signed)
Pt informed. Deseree Blount, CMA  

## 2017-09-21 NOTE — Telephone Encounter (Signed)
Placed order for allegra. Please let patient know.  Dani Gobble, MD [facility], PGY-3

## 2017-09-29 MED FILL — medroxyPROGESTERone ACETATE: 150 | 90 days supply | Qty: 1 | Fill #0

## 2017-10-03 ENCOUNTER — Ambulatory Visit (INDEPENDENT_AMBULATORY_CARE_PROVIDER_SITE_OTHER): Payer: No Typology Code available for payment source | Admitting: *Deleted

## 2017-10-03 DIAGNOSIS — Z3042 Encounter for surveillance of injectable contraceptive: Secondary | ICD-10-CM | POA: Diagnosis not present

## 2017-10-03 MED ORDER — MEDROXYPROGESTERONE ACETATE 150 MG/ML IM SUSP
150.0000 mg | Freq: Once | INTRAMUSCULAR | Status: AC
Start: 2017-10-03 — End: 2017-10-03
  Administered 2017-10-03: 150 mg via INTRAMUSCULAR

## 2017-12-23 ENCOUNTER — Encounter: Payer: Self-pay | Admitting: Family Medicine

## 2017-12-23 ENCOUNTER — Ambulatory Visit (INDEPENDENT_AMBULATORY_CARE_PROVIDER_SITE_OTHER): Payer: Self-pay | Admitting: Family Medicine

## 2017-12-23 DIAGNOSIS — G8929 Other chronic pain: Secondary | ICD-10-CM

## 2017-12-23 DIAGNOSIS — K089 Disorder of teeth and supporting structures, unspecified: Principal | ICD-10-CM

## 2017-12-23 DIAGNOSIS — J029 Acute pharyngitis, unspecified: Secondary | ICD-10-CM

## 2017-12-23 LAB — POCT RAPID STREP A (OFFICE): Rapid Strep A Screen: NEGATIVE

## 2017-12-23 NOTE — Patient Instructions (Signed)
Dental Pain Dental pain may be caused by many things, including:  Tooth decay (cavities or caries). Cavities cause the nerve of your tooth to be open to air and hot or cold temperatures. This can cause pain or discomfort.  Abscess or infection. A dental abscess is an area that is full of infected pus from a bacterial infection in the inner part of the tooth (pulp). It usually happens at the end of the tooth's root.  Injury.  An unknown reason (idiopathic).  Your pain may be mild or severe. It may only happen when:  You are chewing.  You are exposed to hot or cold temperature.  You are eating or drinking sugary foods or beverages, such as: ? Soda. ? Candy.  Your pain may also be there all of the time. Follow these instructions at home: Watch your dental pain for any changes. Do these things to lessen your discomfort:  Take medicines only as told by your dentist.  If your dentist tells you to take an antibiotic medicine, finish all of it even if you start to feel better.  Keep all follow-up visits as told by your dentist. This is important.  Do not apply heat to the outside of your face.  Rinse your mouth or gargle with salt water if told by your dentist. This helps with pain and swelling. ? You can make salt water by adding  tsp of salt to 1 cup of warm water.  Apply ice to the painful area of your face: ? Put ice in a plastic bag. ? Place a towel between your skin and the bag. ? Leave the ice on for 20 minutes, 2-3 times per day.  Avoid foods or drinks that cause you pain, such as: ? Very hot or very cold foods or drinks. ? Sweet or sugary foods or drinks.  Contact a doctor if:  Your pain is not helped with medicines.  Your symptoms are worse.  You have new symptoms. Get help right away if:  You cannot open your mouth.  You are having trouble breathing or swallowing.  You have a fever.  Your face, neck, or jaw is puffy (swollen). This information is not  intended to replace advice given to you by your health care provider. Make sure you discuss any questions you have with your health care provider. Document Released: 10/06/2007 Document Revised: 09/25/2015 Document Reviewed: 04/15/2014 Elsevier Interactive Patient Education  2018 [employer] Inc[employer]  

## 2017-12-23 NOTE — Progress Notes (Signed)
Amy Barrett is a 27 y.o. female  who presents with 3 days of sore throat and right sided tooth pain. She reports that she has experienced these symptoms for the last 3 days. She has attempted to self treat with warm salt water rinses, oragel topical anesthetic, and OTC motrin for pain. She is concerned about the persistence of symptoms and the pain. Patient reports that she is a smoker and has been advised in the past by her dentist that her wisdom teeth need to be removed. She recently has a tonsillar tag that resolved on its own. She experienced similar symptoms in the last 3 months on the right side but this time has no facial swelling or fever.    Review of Systems  Constitutional: Negative for chills, fever and malaise/fatigue.  HENT: Positive for sore throat. Negative for congestion, ear discharge, ear pain and sinus pain.   Eyes: Negative.   Respiratory: Negative for cough, sputum production and shortness of breath.   Cardiovascular: Negative.  Negative for chest pain.  Gastrointestinal: Negative for abdominal pain, diarrhea, nausea and vomiting.  Genitourinary: Negative for dysuria, frequency, hematuria and urgency.  Musculoskeletal: Negative for myalgias.  Skin: Negative.   Neurological: Negative for headaches.  Endo/Heme/Allergies: Negative.   Psychiatric/Behavioral: Negative.     O: Vitals:   12/23/17 1244  BP: 126/78  Pulse: 66  Resp: 16  Temp: 98.7 F (37.1 C)  SpO2: 99%     Physical Exam  Constitutional: She is oriented to person, place, and time. Vital signs are normal. She appears well-developed and well-nourished. She is active.  Non-toxic appearance. She does not have a sickly appearance.  HENT:  Head: Normocephalic.  Right Ear: Hearing, tympanic membrane, external ear and ear canal normal.  Left Ear: Hearing, tympanic membrane, external ear and ear canal normal.  Nose: Nose normal.  Mouth/Throat: Uvula is midline. Posterior oropharyngeal erythema present.  No oropharyngeal exudate, posterior oropharyngeal edema or tonsillar abscesses.    Mild erythema and edema to left posterior corner of mouth- known issues of the need for wisdom tooth removal.   Neck: Normal range of motion. Neck supple.  Cardiovascular: Normal rate, regular rhythm, normal heart sounds and normal pulses.  Pulmonary/Chest: Effort normal and breath sounds normal.  Abdominal: Soft. Bowel sounds are normal.  Musculoskeletal: Normal range of motion.  Lymphadenopathy:       Head (right side): No submental and no submandibular adenopathy present.       Head (left side): No submental and no submandibular adenopathy present.    She has no cervical adenopathy.  Neurological: She is alert and oriented to person, place, and time.  Psychiatric: She has a normal mood and affect.  Vitals reviewed.  A: 1. Sore throat    P: Discussed exam findings, diagnosis etiology and medication use and indications reviewed with patient. Follow- Up and discharge instructions provided. No emergent/urgent issues found on exam.  Patient verbalized understanding of information provided and agrees with plan of care (POC), all questions answered.  Advised that definitive care would be to have the dental procedure and that infections, pain and discomfort can continue to occur as long as this treatment is avoided. Advised to keep oral cavity clean, with frequent toothbrush changes, antiseptics use and good oral hygiene. Salt water rinse and Ibuprofen advised for discomfort.  1. Sore throat - POCT rapid strep A Results for orders placed or performed in visit on 12/23/17 (from the past 24 hour(s))  POCT rapid strep A  Status: Normal   Collection Time: 12/23/17  1:02 PM  Result Value Ref Range   Rapid Strep A Screen Negative Negative

## 2017-12-26 ENCOUNTER — Telehealth: Payer: Self-pay

## 2017-12-26 MED FILL — medroxyPROGESTERone ACETATE: 150 | 90 days supply | Qty: 1 | Fill #1

## 2017-12-26 NOTE — Telephone Encounter (Signed)
Patient states she is doing well.

## 2017-12-28 ENCOUNTER — Ambulatory Visit (INDEPENDENT_AMBULATORY_CARE_PROVIDER_SITE_OTHER): Payer: No Typology Code available for payment source | Admitting: *Deleted

## 2017-12-28 DIAGNOSIS — Z3042 Encounter for surveillance of injectable contraceptive: Secondary | ICD-10-CM | POA: Diagnosis not present

## 2017-12-28 MED ORDER — MEDROXYPROGESTERONE ACETATE 150 MG/ML IM SUSP
150.0000 mg | Freq: Once | INTRAMUSCULAR | Status: AC
  Administered 2017-12-28: 150 mg via INTRAMUSCULAR

## 2018-01-30 MED FILL — hydrOXYzine HCL 50 MG TABS: 50 | 30 days supply | Qty: 30 | Fill #1

## 2018-03-04 ENCOUNTER — Ambulatory Visit (INDEPENDENT_AMBULATORY_CARE_PROVIDER_SITE_OTHER): Payer: Self-pay | Admitting: Nurse Practitioner

## 2018-03-04 VITALS — BP 125/62 | HR 72 | Temp 99.2°F | Resp 16 | Wt 150.4 lb

## 2018-03-04 DIAGNOSIS — B3731 Acute candidiasis of vulva and vagina: Secondary | ICD-10-CM

## 2018-03-04 DIAGNOSIS — B373 Candidiasis of vulva and vagina: Secondary | ICD-10-CM

## 2018-03-04 MED ORDER — FLUCONAZOLE 150 MG PO TABS: 150.0000 mg | ORAL_TABLET | Freq: Once | ORAL | 0 refills | Status: AC

## 2018-03-04 NOTE — Patient Instructions (Addendum)
Vaginal Yeast infection, Adult -Take medications as prescribed. -Perform a sitz bath.  You will need to use Epson salt and a large tub of bath water.  Make sure water is warm, lukewarm and not too hot.  Soak for about 15 to 20 minutes to help with vaginal irritation and labial swelling. -Avoid scented soaps.  I recommend using unscented soaps, and would also only use water to clean the vaginal area. -Follow-up if you develop worsening symptoms such as foul-smelling discharge, but may need to go to the emergency department if you develop abdominal pain, vaginal bleeding or other concerns. -I recommend you follow-up with GYN to determine cause for recurrence.  Recommend Dr. Mitchel Honour, you can reach their office at [phone]. -Follow-up in our office as needed.   Vaginal yeast infection is a condition that causes soreness, swelling, and redness (inflammation) of the vagina. It also causes vaginal discharge. This is a common condition. Some women get this infection frequently. What are the causes? This condition is caused by a change in the normal balance of the yeast (candida) and bacteria that live in the vagina. This change causes an overgrowth of yeast, which causes the inflammation. What increases the risk? This condition is more likely to develop in:  Women who take antibiotic medicines.  Women who have diabetes.  Women who take birth control pills.  Women who are pregnant.  Women who douche often.  Women who have a weak defense (immune) system.  Women who have been taking steroid medicines for a long time.  Women who frequently wear tight clothing.  What are the signs or symptoms? Symptoms of this condition include:  White, thick vaginal discharge.  Swelling, itching, redness, and irritation of the vagina. The lips of the vagina (vulva) may be affected as well.  Pain or a burning feeling while urinating.  Pain during sex.  How is this diagnosed? This condition is  diagnosed with a medical history and physical exam. This will include a pelvic exam. Your health care provider will examine a sample of your vaginal discharge under a microscope. Your health care provider may send this sample for testing to confirm the diagnosis. How is this treated? This condition is treated with medicine. Medicines may be over-the-counter or prescription. You may be told to use one or more of the following:  Medicine that is taken orally.  Medicine that is applied as a cream.  Medicine that is inserted directly into the vagina (suppository).  Follow these instructions at home:  Take or apply over-the-counter and prescription medicines only as told by your health care provider.  Do not have sex until your health care provider has approved. Tell your sex partner that you have a yeast infection. That person should go to his or her health care provider if he or she develops symptoms.  Do not wear tight clothes, such as pantyhose or tight pants.  Avoid using tampons until your health care provider approves.  Eat more yogurt. This may help to keep your yeast infection from returning.  Try taking a sitz bath to help with discomfort. This is a warm water bath that is taken while you are sitting down. The water should only come up to your hips and should cover your buttocks. Do this 3-4 times per day or as told by your health care provider.  Do not douche.  Wear breathable, cotton underwear.  If you have diabetes, keep your blood sugar levels under control. Contact a health care provider if:  You have a fever.  Your symptoms go away and then return.  Your symptoms do not get better with treatment.  Your symptoms get worse.  You have new symptoms.  You develop blisters in or around your vagina.  You have blood coming from your vagina and it is not your menstrual period.  You develop pain in your abdomen. This information is not intended to replace advice given  to you by your health care provider. Make sure you discuss any questions you have with your health care provider. Document Released: 01/27/2005 Document Revised: 10/01/2015 Document Reviewed: 10/21/2014 Elsevier Interactive Patient Education  2018 [employer].

## 2018-03-04 NOTE — Progress Notes (Signed)
Subjective:     Amy Barrett is a 27 y.o. female who presents for evaluation of an abnormal vaginal discharge. Symptoms have been present for 3 days. Vaginal symptoms: discharge described as white, milky, vaginal erythema noted and vulvar erythema noted, local irritation and vulvar itching. Contraception: Depo-Provera injections. She denies abnormal bleeding, odor, pain and urinary symptoms of lower abdominal pain, urinary frequency, urinary hesitancy and urinary urgency Sexually transmitted infection risk: very low risk of STD exposure and patient has been abstinent for 4 years. 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  The patient states this is a recurrent problem.  Patient is wondering if this is related to her ovulation cycle, but it is difficult to tell due to being on the Depakote shots.  Patient states when she was last seen, her PCP did not find any yeast on the wet prep, therefore no treatment was prescribed..  The following portions of the patient's history were reviewed and updated as appropriate: allergies, current medications and past medical history.   Review of Systems Constitutional: negative Ears, nose, mouth, throat, and face: negative Respiratory: negative Cardiovascular: negative Gastrointestinal: negative Genitourinary:positive for vaginal discharge and See HPI, negative for abnormal menstrual periods, frequency, hematuria and hesitancy    Objective:    BP 125/62 (BP Location: Right Arm, Patient Position: Sitting, Cuff Size: Normal)   Pulse 72   Temp 99.2 F (37.3 C) (Oral)   Resp 16   Wt 150 lb 6.4 oz (68.2 kg)   SpO2 100%   BMI 23.56 kg/m  General appearance: alert, cooperative, mild distress and due to vaginal discomfort Head: Normocephalic, without obvious abnormality, atraumatic Throat: lips, mucosa, and tongue normal; teeth and gums normal Lungs: clear to auscultation bilaterally Heart: regular rate and rhythm, S1, S2 normal, no murmur,  click, rub or gallop Abdomen: soft, non-tender; bowel sounds normal; no masses,  no organomegaly Pelvic: no cervical motion tenderness and external labia with edema and excoriation, labia minora swollen and inflammed. No discharge noted at this time. Neurologic: Grossly normal    Assessment:    Candidal Vulvovaginitis.    Plan:   Exam findings, diagnosis etiology and medication use and indications reviewed with patient. Follow- Up and discharge instructions provided. No emergent/urgent issues found on exam. Patient education was provided. Patient verbalized understanding of information provided and agrees with plan of care (POC), all questions answered. The patient is advised to call or return to clinic if condition does not see an improvement in symptoms, or to seek the care of the closest emergency department if condition worsens with the above plan.    1. Candidal vulvovaginitis  - fluconazole (DIFLUCAN) 150 MG tablet; Take 1 tablet (150 mg total) by mouth once for 1 dose. May repeat every 72 hours, max 3 doses.  Dispense: 3 tablet; Refill: 0 -Take medications as prescribed. -Perform a sitz bath.  You will need to use Epson salt and a large tub of bath water.  Make sure water is warm, lukewarm and not too hot.  Soak for about 15 to 20 minutes to help with vaginal irritation and labial swelling. -Avoid scented soaps.  I recommend using unscented soaps, and would also only use water to clean the vaginal area. -Follow-up if you develop worsening symptoms such as foul-smelling discharge, but may need to go to the emergency department if you develop abdominal pain, vaginal bleeding or other concerns. -I recommend you follow-up with GYN to determine cause for recurrence.  Recommend Dr. Mitchel Honour, you  can reach their office at [phone]. -Follow-up in our office as needed.

## 2018-03-07 ENCOUNTER — Encounter: Payer: Self-pay | Admitting: Women's Health

## 2018-03-07 ENCOUNTER — Ambulatory Visit: Payer: No Typology Code available for payment source | Admitting: Women's Health

## 2018-03-07 VITALS — BP 126/80

## 2018-03-07 DIAGNOSIS — B373 Candidiasis of vulva and vagina: Secondary | ICD-10-CM

## 2018-03-07 DIAGNOSIS — R300 Dysuria: Secondary | ICD-10-CM

## 2018-03-07 DIAGNOSIS — B3731 Acute candidiasis of vulva and vagina: Secondary | ICD-10-CM

## 2018-03-07 DIAGNOSIS — N898 Other specified noninflammatory disorders of vagina: Secondary | ICD-10-CM | POA: Diagnosis not present

## 2018-03-07 LAB — WET PREP FOR TRICH, YEAST, CLUE

## 2018-03-07 MED ORDER — FLUCONAZOLE 150 MG PO TABS: 150.0000 mg | ORAL_TABLET | Freq: Once | ORAL | 0 refills | Status: AC

## 2018-03-07 NOTE — Progress Notes (Signed)
27 year old SBF G0 presents with complaint of recurrent yeast, was treated with Diflucan 150 mg and an urgent care on 03/04/2018.  Symptoms of vaginal itching are better but questions why she continues to have recurrent yeast infections.  Mild urinary burning at initiation of stream.   Denies abdominal/back pain, nausea, pain at end of stream of urination, GI changes or fever.  No recent change in diet. Amenorrheic on Depo-Provera for dysmenorrhea.  Not sexually active for years, denies need for STD screen.  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 in [employer].  Exam: Appears well.   abdomen soft, nontender.  External genitalia mild erythema, speculum exam minimal visible discharge, no odor, wet prep negative. UA: Negative nitrites, negative leukocytes, 0-5 WBCs, no RBCs, 6-10 squamous epithelials, few bacteria  Recurrent yeast  Plan: Options reviewed we will try boric acid gelcaps 600 mg per vagina twice weekly.  Yeast prevention discussed.  Loose clothing, open to air as able.  Instructed to call if continued problems.  Glucose pending.

## 2018-03-07 NOTE — Patient Instructions (Signed)
Boric acid  Gel caps  600mg   Per vagina 2 times per week  Vaginal Yeast infection, Adult Vaginal yeast infection is a condition that causes soreness, swelling, and redness (inflammation) of the vagina. It also causes vaginal discharge. This is a common condition. Some women get this infection frequently. What are the causes? This condition is caused by a change in the normal balance of the yeast (candida) and bacteria that live in the vagina. This change causes an overgrowth of yeast, which causes the inflammation. What increases the risk? This condition is more likely to develop in:  Women who take antibiotic medicines.  Women who have diabetes.  Women who take birth control pills.  Women who are pregnant.  Women who douche often.  Women who have a weak defense (immune) system.  Women who have been taking steroid medicines for a long time.  Women who frequently wear tight clothing.  What are the signs or symptoms? Symptoms of this condition include:  White, thick vaginal discharge.  Swelling, itching, redness, and irritation of the vagina. The lips of the vagina (vulva) may be affected as well.  Pain or a burning feeling while urinating.  Pain during sex.  How is this diagnosed? This condition is diagnosed with a medical history and physical exam. This will include a pelvic exam. Your health care provider will examine a sample of your vaginal discharge under a microscope. Your health care provider may send this sample for testing to confirm the diagnosis. How is this treated? This condition is treated with medicine. Medicines may be over-the-counter or prescription. You may be told to use one or more of the following:  Medicine that is taken orally.  Medicine that is applied as a cream.  Medicine that is inserted directly into the vagina (suppository).  Follow these instructions at home:  Take or apply over-the-counter and prescription medicines only as told by your  health care provider.  Do not have sex until your health care provider has approved. Tell your sex partner that you have a yeast infection. That person should go to his or her health care provider if he or she develops symptoms.  Do not wear tight clothes, such as pantyhose or tight pants.  Avoid using tampons until your health care provider approves.  Eat more yogurt. This may help to keep your yeast infection from returning.  Try taking a sitz bath to help with discomfort. This is a warm water bath that is taken while you are sitting down. The water should only come up to your hips and should cover your buttocks. Do this 3-4 times per day or as told by your health care provider.  Do not douche.  Wear breathable, cotton underwear.  If you have diabetes, keep your blood sugar levels under control. Contact a health care provider if:  You have a fever.  Your symptoms go away and then return.  Your symptoms do not get better with treatment.  Your symptoms get worse.  You have new symptoms.  You develop blisters in or around your vagina.  You have blood coming from your vagina and it is not your menstrual period.  You develop pain in your abdomen. This information is not intended to replace advice given to you by your health care provider. Make sure you discuss any questions you have with your health care provider. Document Released: 01/27/2005 Document Revised: 10/01/2015 Document Reviewed: 10/21/2014 Elsevier Interactive Patient Education  2018 [employer].

## 2018-03-09 LAB — URINE CULTURE
MICRO NUMBER:: 91332421
RESULT: NO GROWTH
SPECIMEN QUALITY: ADEQUATE

## 2018-03-09 LAB — URINALYSIS, COMPLETE W/RFL CULTURE
BILIRUBIN URINE: NEGATIVE
Glucose, UA: NEGATIVE
HYALINE CAST: NONE SEEN /LPF
Hgb urine dipstick: NEGATIVE
Leukocyte Esterase: NEGATIVE
Nitrites, Initial: NEGATIVE
PROTEIN: NEGATIVE
RBC / HPF: NONE SEEN /HPF (ref 0–2)
Specific Gravity, Urine: 1.02 (ref 1.001–1.03)
pH: 7 (ref 5.0–8.0)

## 2018-03-09 LAB — CULTURE INDICATED

## 2018-03-24 MED FILL — medroxyPROGESTERone ACETATE: 150 | 90 days supply | Qty: 1 | Fill #2

## 2018-03-24 MED FILL — hydrOXYzine HCL 50 MG TABS: 50 | 30 days supply | Qty: 30 | Fill #2

## 2018-03-27 ENCOUNTER — Ambulatory Visit (INDEPENDENT_AMBULATORY_CARE_PROVIDER_SITE_OTHER): Payer: No Typology Code available for payment source

## 2018-03-27 DIAGNOSIS — Z3042 Encounter for surveillance of injectable contraceptive: Secondary | ICD-10-CM

## 2018-03-27 MED ORDER — MEDROXYPROGESTERONE ACETATE 150 MG/ML IM SUSP
150.0000 mg | Freq: Once | INTRAMUSCULAR | Status: AC
  Administered 2018-03-27: 150 mg via INTRAMUSCULAR

## 2018-05-18 MED FILL — FLUCONAZOLE 150 MG TABS: 150 | 7 days supply | Qty: 3 | Fill #0

## 2018-06-09 MED FILL — medroxyPROGESTERone ACETATE: 150 | 90 days supply | Qty: 1 | Fill #3

## 2018-06-13 ENCOUNTER — Ambulatory Visit (INDEPENDENT_AMBULATORY_CARE_PROVIDER_SITE_OTHER): Payer: No Typology Code available for payment source | Admitting: *Deleted

## 2018-06-13 DIAGNOSIS — Z3042 Encounter for surveillance of injectable contraceptive: Secondary | ICD-10-CM

## 2018-06-13 MED ORDER — MEDROXYPROGESTERONE ACETATE 150 MG/ML IM SUSP
150.0000 mg | Freq: Once | INTRAMUSCULAR | Status: AC
  Administered 2018-06-13: 150 mg via INTRAMUSCULAR

## 2018-08-08 ENCOUNTER — Other Ambulatory Visit: Payer: Self-pay

## 2018-08-09 MED ORDER — FLUTICASONE PROPIONATE 50 MCG/ACT NA SUSP: 2.0000 | Freq: Every day | NASAL | 6 refills | Status: DC

## 2018-08-09 MED ORDER — FEXOFENADINE-PSEUDOEPHED ER 180-240 MG PO TB24: 1.0000 | ORAL_TABLET | Freq: Every day | ORAL | 0 refills | Status: DC

## 2018-08-09 MED FILL — FLUTICASONE PROP 50 MCG SPR: 50 | 30 days supply | Qty: 16 | Fill #0

## 2018-08-14 ENCOUNTER — Encounter: Payer: No Typology Code available for payment source | Admitting: Women's Health

## 2018-08-18 ENCOUNTER — Other Ambulatory Visit: Payer: Self-pay

## 2018-08-21 ENCOUNTER — Encounter: Payer: No Typology Code available for payment source | Admitting: Women's Health

## 2018-08-21 ENCOUNTER — Other Ambulatory Visit: Payer: Self-pay | Admitting: Women's Health

## 2018-08-21 MED FILL — medroxyPROGESTERone ACETATE: 150 | 90 days supply | Qty: 1 | Fill #0

## 2018-08-21 NOTE — Telephone Encounter (Signed)
annual exam scheduled on 12/12/18

## 2018-08-24 MED FILL — hydrOXYzine HCL 50 MG TABS: 50 | 30 days supply | Qty: 30 | Fill #0

## 2018-09-05 ENCOUNTER — Other Ambulatory Visit: Payer: Self-pay

## 2018-09-06 ENCOUNTER — Ambulatory Visit (INDEPENDENT_AMBULATORY_CARE_PROVIDER_SITE_OTHER): Payer: No Typology Code available for payment source | Admitting: Gynecology

## 2018-09-06 DIAGNOSIS — Z3042 Encounter for surveillance of injectable contraceptive: Secondary | ICD-10-CM | POA: Diagnosis not present

## 2018-09-06 MED ORDER — MEDROXYPROGESTERONE ACETATE 150 MG/ML IM SUSP
150.0000 mg | Freq: Once | INTRAMUSCULAR | Status: AC
  Administered 2018-09-06: 14:00:00 150 mg via INTRAMUSCULAR

## 2018-09-18 MED FILL — ALLEGRA-D 24 HOUR TABLET: 180-240 | 30 days supply | Qty: 30 | Fill #0

## 2018-09-18 MED FILL — FLUTICASONE PROP 50 MCG SPR: 50 | 30 days supply | Qty: 16 | Fill #1

## 2018-11-24 ENCOUNTER — Other Ambulatory Visit: Payer: Self-pay | Admitting: Women's Health

## 2018-11-24 ENCOUNTER — Other Ambulatory Visit: Payer: Self-pay

## 2018-11-24 MED FILL — medroxyPROGESTERone ACETATE: 150 | 90 days supply | Qty: 1 | Fill #0

## 2018-11-27 ENCOUNTER — Ambulatory Visit (INDEPENDENT_AMBULATORY_CARE_PROVIDER_SITE_OTHER): Payer: No Typology Code available for payment source | Admitting: *Deleted

## 2018-11-27 ENCOUNTER — Other Ambulatory Visit: Payer: Self-pay

## 2018-11-27 DIAGNOSIS — Z3042 Encounter for surveillance of injectable contraceptive: Secondary | ICD-10-CM

## 2018-11-27 MED ORDER — MEDROXYPROGESTERONE ACETATE 150 MG/ML IM SUSP
150.0000 mg | Freq: Once | INTRAMUSCULAR | Status: AC
  Administered 2018-11-27: 150 mg via INTRAMUSCULAR

## 2018-12-11 ENCOUNTER — Other Ambulatory Visit: Payer: Self-pay

## 2018-12-12 ENCOUNTER — Ambulatory Visit (INDEPENDENT_AMBULATORY_CARE_PROVIDER_SITE_OTHER): Payer: No Typology Code available for payment source | Admitting: Women's Health

## 2018-12-12 ENCOUNTER — Encounter: Payer: Self-pay | Admitting: Women's Health

## 2018-12-12 VITALS — BP 122/80 | Ht 67.0 in | Wt 145.0 lb

## 2018-12-12 DIAGNOSIS — Z13220 Encounter for screening for lipoid disorders: Secondary | ICD-10-CM | POA: Diagnosis not present

## 2018-12-12 DIAGNOSIS — Z01419 Encounter for gynecological examination (general) (routine) without abnormal findings: Secondary | ICD-10-CM | POA: Diagnosis not present

## 2018-12-12 MED ORDER — FLUTICASONE PROPIONATE 50 MCG/ACT NA SUSP: 2.0000 | Freq: Every day | NASAL | 6 refills | Status: DC

## 2018-12-12 MED ORDER — MEDROXYPROGESTERONE ACETATE 150 MG/ML IM SUSY: 150.0000 mg | PREFILLED_SYRINGE | INTRAMUSCULAR | 4 refills | Status: DC

## 2018-12-12 MED ORDER — HYDROXYZINE HCL 50 MG PO TABS: 50.0000 mg | ORAL_TABLET | Freq: Three times a day (TID) | ORAL | 1 refills | Status: DC | PRN

## 2018-12-12 NOTE — Progress Notes (Signed)
Amy Barrett 1990-06-24 [id number]    History:    Presents for annual exam.  Amenorrheic on Depo-Provera without complaints.  Not sexually active for greater than 5 years.  Did not receive Gardasil.  Normal Pap history.  Past medical history, past surgical history, family history and social history were all reviewed and documented in the EPIC chart.  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  ROS:  A ROS was performed and pertinent positives and negatives are included.  Exam:  Vitals:   12/12/18 1034  BP: 122/80  Weight: 145 lb (65.8 kg)  Height: 5\' 7"  (1.702 m)   Body mass index is 22.71 kg/m.   General appearance:  Normal mild scoliosis Thyroid:  Symmetrical, normal in size, without palpable masses or nodularity. Respiratory  Auscultation:  Clear without wheezing or rhonchi Cardiovascular  Auscultation:  Regular rate, without rubs, murmurs or gallops  Edema/varicosities:  Not grossly evident Abdominal  Soft,nontender, without masses, guarding or rebound.  Liver/spleen:  No organomegaly noted  Hernia:  None appreciated  Skin  Inspection:  Grossly normal   Breasts: Examined lying and sitting.     Right: Without masses, retractions, discharge or axillary adenopathy.     Left: Without masses, retractions, discharge or axillary adenopathy. Gentitourinary   Inguinal/mons:  Normal without inguinal adenopathy  External genitalia:  Normal  BUS/Urethra/Skene's glands:  Normal  Vagina:  Normal  Cervix:  Normal  Uterus:   normal in size, shape and contour.  Midline and mobile  Adnexa/parametria:     Rt: Without masses or tenderness.   Lt: Without masses or tenderness.  Anus and perineum: Normal    Assessment/Plan:  28 y.o. SBF G0 for annual exam with no complaints.  Amenorrheic on Depo-Provera Seasonal allergies  Plan: Depo-Provera 150 mg q. 12 weeks, prescription, proper use given and reviewed.  Reviewed importance of calcium rich diet, refill of Flonase nasal spray uses  occasionally for seasonal allergies and Vistaril 50 mg tablet as needed.  SBEs, exercise, calcium rich foods, MVI daily encouraged.  CBC, glucose, lipid panel, Pap normal 2019, new screening guidelines reviewed.    [place], 12:47 PM 12/12/2018

## 2018-12-12 NOTE — Patient Instructions (Signed)
Health Maintenance, Female Adopting a healthy lifestyle and getting preventive care are important in promoting health and wellness. Ask your health care provider about:  The right schedule for you to have regular tests and exams.  Things you can do on your own to prevent diseases and keep yourself healthy. What should I know about diet, weight, and exercise? Eat a healthy diet   Eat a diet that includes plenty of vegetables, fruits, low-fat dairy products, and lean protein.  Do not eat a lot of foods that are high in solid fats, added sugars, or sodium. Maintain a healthy weight Body mass index (BMI) is used to identify weight problems. It estimates body fat based on height and weight. Your health care provider can help determine your BMI and help you achieve or maintain a healthy weight. Get regular exercise Get regular exercise. This is one of the most important things you can do for your health. Most adults should:  Exercise for at least 150 minutes each week. The exercise should increase your heart rate and make you sweat (moderate-intensity exercise).  Do strengthening exercises at least twice a week. This is in addition to the moderate-intensity exercise.  Spend less time sitting. Even light physical activity can be beneficial. Watch cholesterol and blood lipids Have your blood tested for lipids and cholesterol at 30 years of age, then have this test every 5 years. Have your cholesterol levels checked more often if:  Your lipid or cholesterol levels are high.  You are older than 30 years of age.  You are at high risk for heart disease. What should I know about cancer screening? Depending on your health history and family history, you may need to have cancer screening at various ages. This may include screening for:  Breast cancer.  Cervical cancer.  Colorectal cancer.  Skin cancer.  Lung cancer. What should I know about heart disease, diabetes, and high blood  pressure? Blood pressure and heart disease  High blood pressure causes heart disease and increases the risk of stroke. This is more likely to develop in people who have high blood pressure readings, are of African descent, or are overweight.  Have your blood pressure checked: ? Every 3-5 years if you are 18-39 years of age. ? Every year if you are 40 years old or older. Diabetes Have regular diabetes screenings. This checks your fasting blood sugar level. Have the screening done:  Once every three years after age 40 if you are at a normal weight and have a low risk for diabetes.  More often and at a younger age if you are overweight or have a high risk for diabetes. What should I know about preventing infection? Hepatitis B If you have a higher risk for hepatitis B, you should be screened for this virus. Talk with your health care provider to find out if you are at risk for hepatitis B infection. Hepatitis C Testing is recommended for:  Everyone born from 1945 through 1965.  Anyone with known risk factors for hepatitis C. Sexually transmitted infections (STIs)  Get screened for STIs, including gonorrhea and chlamydia, if: ? You are sexually active and are younger than 30 years of age. ? You are older than 30 years of age and your health care provider tells you that you are at risk for this type of infection. ? Your sexual activity has changed since you were last screened, and you are at increased risk for chlamydia or gonorrhea. Ask your health care provider if   you are at risk.  Ask your health care provider about whether you are at high risk for HIV. Your health care provider may recommend a prescription medicine to help prevent HIV infection. If you choose to take medicine to prevent HIV, you should first get tested for HIV. You should then be tested every 3 months for as long as you are taking the medicine. Pregnancy  If you are about to stop having your period (premenopausal) and  you may become pregnant, seek counseling before you get pregnant.  Take 400 to 800 micrograms (mcg) of folic acid every day if you become pregnant.  Ask for birth control (contraception) if you want to prevent pregnancy. Osteoporosis and menopause Osteoporosis is a disease in which the bones lose minerals and strength with aging. This can result in bone fractures. If you are 65 years old or older, or if you are at risk for osteoporosis and fractures, ask your health care provider if you should:  Be screened for bone loss.  Take a calcium or vitamin D supplement to lower your risk of fractures.  Be given hormone replacement therapy (HRT) to treat symptoms of menopause. Follow these instructions at home: Lifestyle  Do not use any products that contain nicotine or tobacco, such as cigarettes, e-cigarettes, and chewing tobacco. If you need help quitting, ask your health care provider.  Do not use street drugs.  Do not share needles.  Ask your health care provider for help if you need support or information about quitting drugs. Alcohol use  Do not drink alcohol if: ? Your health care provider tells you not to drink. ? You are pregnant, may be pregnant, or are planning to become pregnant.  If you drink alcohol: ? Limit how much you use to 0-1 drink a day. ? Limit intake if you are breastfeeding.  Be aware of how much alcohol is in your drink. In the [place] one drink equals one 12 oz bottle of beer (355 mL), one 5 oz glass of wine (148 mL), or one 1 oz glass of hard liquor (44 mL). General instructions  Schedule regular health, dental, and eye exams.  Stay current with your vaccines.  Tell your health care provider if: ? You often feel depressed. ? You have ever been abused or do not feel safe at home. Summary  Adopting a healthy lifestyle and getting preventive care are important in promoting health and wellness.  Follow your health care provider's instructions about healthy  diet, exercising, and getting tested or screened for diseases.  Follow your health care provider's instructions on monitoring your cholesterol and blood pressure. This information is not intended to replace advice given to you by your health care provider. Make sure you discuss any questions you have with your health care provider. Document Released: 11/02/2010 Document Revised: 04/12/2018 Document Reviewed: 04/12/2018 Elsevier Patient Education  2020 Elsevier Inc.  

## 2018-12-13 LAB — CBC WITH DIFFERENTIAL/PLATELET
Absolute Monocytes: 370 cells/uL (ref 200–950)
Basophils Absolute: 53 cells/uL (ref 0–200)
Basophils Relative: 0.8 %
Eosinophils Absolute: 99 cells/uL (ref 15–500)
Eosinophils Relative: 1.5 %
HCT: 37.8 % (ref 35.0–45.0)
Hemoglobin: 12.5 g/dL (ref 11.7–15.5)
Lymphs Abs: 1822 cells/uL (ref 850–3900)
MCH: 29.9 pg (ref 27.0–33.0)
MCHC: 33.1 g/dL (ref 32.0–36.0)
MCV: 90.4 fL (ref 80.0–100.0)
MPV: 11.2 fL (ref 7.5–12.5)
Monocytes Relative: 5.6 %
Neutro Abs: 4257 cells/uL (ref 1500–7800)
Neutrophils Relative %: 64.5 %
Platelets: 213 10*3/uL (ref 140–400)
RBC: 4.18 10*6/uL (ref 3.80–5.10)
RDW: 11.9 % (ref 11.0–15.0)
Total Lymphocyte: 27.6 %
WBC: 6.6 10*3/uL (ref 3.8–10.8)

## 2018-12-13 LAB — GLUCOSE, RANDOM: Glucose, Bld: 82 mg/dL (ref 65–99)

## 2018-12-13 LAB — LIPID PANEL
Cholesterol: 149 mg/dL (ref <200)
HDL: 38 mg/dL — ABNORMAL LOW (ref >=50)
LDL Cholesterol (Calc): 97 mg/dL (calc)
Non-HDL Cholesterol (Calc): 111 mg/dL (calc) (ref <130)
Total CHOL/HDL Ratio: 3.9 (calc) (ref <5.0)
Triglycerides: 55 mg/dL (ref <150)

## 2018-12-25 MED FILL — hydrOXYzine HCL 50 MG TABS: 50 | 10 days supply | Qty: 30 | Fill #0

## 2019-01-10 MED FILL — hydrOXYzine HCL 50 MG TABS: 50 | 10 days supply | Qty: 30 | Fill #0

## 2019-02-16 MED FILL — medroxyPROGESTERone ACETATE: 150 | 90 days supply | Qty: 1 | Fill #0

## 2019-02-19 ENCOUNTER — Ambulatory Visit (INDEPENDENT_AMBULATORY_CARE_PROVIDER_SITE_OTHER): Payer: No Typology Code available for payment source | Admitting: *Deleted

## 2019-02-19 ENCOUNTER — Other Ambulatory Visit: Payer: Self-pay

## 2019-02-19 DIAGNOSIS — Z3042 Encounter for surveillance of injectable contraceptive: Secondary | ICD-10-CM

## 2019-02-19 MED ORDER — MEDROXYPROGESTERONE ACETATE 150 MG/ML IM SUSP
150.0000 mg | Freq: Once | INTRAMUSCULAR | Status: AC
  Administered 2019-02-19: 150 mg via INTRAMUSCULAR

## 2019-03-02 ENCOUNTER — Encounter: Payer: Self-pay | Admitting: Women's Health

## 2019-03-12 ENCOUNTER — Encounter: Payer: Self-pay | Admitting: Family Medicine

## 2019-03-12 ENCOUNTER — Encounter: Payer: Self-pay | Admitting: Women's Health

## 2019-04-26 ENCOUNTER — Encounter: Payer: Self-pay | Admitting: Family Medicine

## 2019-06-12 NOTE — Telephone Encounter (Signed)
Error

## 2019-07-04 ENCOUNTER — Other Ambulatory Visit: Payer: Self-pay | Admitting: Women's Health

## 2019-07-04 MED ORDER — FLUCONAZOLE 150 MG PO TABS: 150.0000 mg | ORAL_TABLET | Freq: Once | ORAL | 0 refills | Status: AC

## 2019-07-04 MED ORDER — HYDROXYZINE HCL 50 MG PO TABS: 50.0000 mg | ORAL_TABLET | Freq: Three times a day (TID) | ORAL | 1 refills | Status: DC | PRN

## 2019-07-05 ENCOUNTER — Other Ambulatory Visit: Payer: Self-pay | Admitting: Women's Health

## 2019-07-09 ENCOUNTER — Other Ambulatory Visit: Payer: Self-pay | Admitting: Family Medicine

## 2019-07-09 ENCOUNTER — Encounter: Payer: Self-pay | Admitting: Family Medicine

## 2019-07-09 DIAGNOSIS — T4995XA Adverse effect of unspecified topical agent, initial encounter: Secondary | ICD-10-CM

## 2019-07-09 DIAGNOSIS — R238 Other skin changes: Secondary | ICD-10-CM

## 2019-07-09 NOTE — Progress Notes (Signed)
Placed referral for dermatology per patient request.

## 2019-07-26 ENCOUNTER — Encounter: Payer: Self-pay | Admitting: Family Medicine

## 2019-08-07 ENCOUNTER — Other Ambulatory Visit: Payer: Self-pay | Admitting: Family Medicine

## 2019-08-07 DIAGNOSIS — M67432 Ganglion, left wrist: Secondary | ICD-10-CM

## 2019-08-07 NOTE — Progress Notes (Signed)
Patient submitted patient advice request asking for referral for hand surgeon. Referral submitted.

## 2019-08-09 ENCOUNTER — Other Ambulatory Visit: Payer: Self-pay | Admitting: Family Medicine

## 2019-08-09 MED ORDER — FEXOFENADINE-PSEUDOEPHED ER 180-240 MG PO TB24: 1.0000 | ORAL_TABLET | Freq: Every day | ORAL | 0 refills | Status: DC

## 2019-08-19 ENCOUNTER — Telehealth: Payer: No Typology Code available for payment source | Admitting: Physician Assistant

## 2019-08-19 ENCOUNTER — Encounter: Payer: Self-pay | Admitting: Physician Assistant

## 2019-08-19 DIAGNOSIS — B3731 Acute candidiasis of vulva and vagina: Secondary | ICD-10-CM

## 2019-08-19 DIAGNOSIS — B373 Candidiasis of vulva and vagina: Secondary | ICD-10-CM | POA: Diagnosis not present

## 2019-08-19 MED ORDER — FLUCONAZOLE 150 MG PO TABS: 150.0000 mg | ORAL_TABLET | Freq: Once | ORAL | 0 refills | Status: AC

## 2019-08-19 NOTE — Progress Notes (Signed)
We are sorry that you are not feeling well. Here is how we plan to help! Based on what you shared with me it looks like you: May have a yeast vaginosis  Vaginosis is an inflammation of the vagina that can result in discharge, itching and pain. The cause is usually a change in the normal balance of vaginal bacteria or an infection. Vaginosis can also result from reduced estrogen levels after menopause.  The most common causes of vaginosis are:   Bacterial vaginosis which results from an overgrowth of one on several organisms that are normally present in your vagina.   Yeast infections which are caused by a naturally occurring fungus called candida.   Vaginal atrophy (atrophic vaginosis) which results from the thinning of the vagina from reduced estrogen levels after menopause.   Trichomoniasis which is caused by a parasite and is commonly transmitted by sexual intercourse.  Factors that increase your risk of developing vaginosis include: . Medications, such as antibiotics and steroids . Uncontrolled diabetes . Use of hygiene products such as bubble bath, vaginal spray or vaginal deodorant . Douching . Wearing damp or tight-fitting clothing . Using an intrauterine device (IUD) for birth control . Hormonal changes, such as those associated with pregnancy, birth control pills or menopause . Sexual activity . Having a sexually transmitted infection  Your treatment plan is A single Diflucan (fluconazole) 150mg tablet once.  I have electronically sent this prescription into the pharmacy that you have chosen.  Be sure to take all of the medication as directed. Stop taking any medication if you develop a rash, tongue swelling or shortness of breath. Mothers who are breast feeding should consider pumping and discarding their breast milk while on these antibiotics. However, there is no consensus that infant exposure at these doses would be harmful.  Remember that medication creams can weaken latex  condoms. .   HOME CARE:  Good hygiene may prevent some types of vaginosis from recurring and may relieve some symptoms:  . Avoid baths, hot tubs and whirlpool spas. Rinse soap from your outer genital area after a shower, and dry the area well to prevent irritation. Don't use scented or harsh soaps, such as those with deodorant or antibacterial action. . Avoid irritants. These include scented tampons and pads. . Wipe from front to back after using the toilet. Doing so avoids spreading fecal bacteria to your vagina.  Other things that may help prevent vaginosis include:  . Don't douche. Your vagina doesn't require cleansing other than normal bathing. Repetitive douching disrupts the normal organisms that reside in the vagina and can actually increase your risk of vaginal infection. Douching won't clear up a vaginal infection. . Use a latex condom. Both female and female latex condoms may help you avoid infections spread by sexual contact. . Wear cotton underwear. Also wear pantyhose with a cotton crotch. If you feel comfortable without it, skip wearing underwear to bed. Yeast thrives in moist environments Your symptoms should improve in the next day or two.  GET HELP RIGHT AWAY IF:  . You have pain in your lower abdomen ( pelvic area or over your ovaries) . You develop nausea or vomiting . You develop a fever . Your discharge changes or worsens . You have persistent pain with intercourse . You develop shortness of breath, a rapid pulse, or you faint.  These symptoms could be signs of problems or infections that need to be evaluated by a medical provider now.  MAKE SURE YOU      Understand these instructions.  Will watch your condition.  Will get help right away if you are not doing well or get worse.  Your e-visit answers were reviewed by a board certified advanced clinical practitioner to complete your personal care plan. Depending upon the condition, your plan could have included  both over the counter or prescription medications. Please review your pharmacy choice to make sure that you have choses a pharmacy that is open for you to pick up any needed prescription, Your safety is important to us. If you have drug allergies check your prescription carefully.   You can use MyChart to ask questions about today's visit, request a non-urgent call back, or ask for a work or school excuse for 24 hours related to this e-Visit. If it has been greater than 24 hours you will need to follow up with your provider, or enter a new e-Visit to address those concerns. You will get a MyChart message within the next two days asking about your experience. I hope that your e-visit has been valuable and will speed your recovery.  I spent 5-10 minutes on review and completion of this note- Arnez Stoneking PAC  

## 2019-08-20 MED FILL — FLUCONAZOLE 150 MG TABS: 150 | 1 days supply | Qty: 1 | Fill #0

## 2019-08-21 ENCOUNTER — Encounter: Payer: No Typology Code available for payment source | Admitting: Women's Health

## 2019-08-31 ENCOUNTER — Encounter: Payer: Self-pay | Admitting: Family Medicine

## 2019-09-21 ENCOUNTER — Encounter: Payer: Self-pay | Admitting: Family Medicine

## 2019-09-21 ENCOUNTER — Other Ambulatory Visit: Payer: Self-pay

## 2019-09-21 MED ORDER — FLUTICASONE PROPIONATE 50 MCG/ACT NA SUSP: 2.0000 | Freq: Every day | NASAL | 6 refills | Status: DC

## 2019-09-21 MED FILL — FLUTICASONE PROP 50 MCG SPR: 50 | 30 days supply | Qty: 16 | Fill #0

## 2019-09-24 ENCOUNTER — Other Ambulatory Visit: Payer: Self-pay | Admitting: Family Medicine

## 2019-10-11 ENCOUNTER — Encounter: Payer: Self-pay | Admitting: Family Medicine

## 2019-10-11 ENCOUNTER — Ambulatory Visit (INDEPENDENT_AMBULATORY_CARE_PROVIDER_SITE_OTHER): Payer: No Typology Code available for payment source | Admitting: Family Medicine

## 2019-10-11 ENCOUNTER — Other Ambulatory Visit: Payer: Self-pay

## 2019-10-11 VITALS — BP 108/62 | HR 90 | Ht 67.0 in | Wt 151.1 lb

## 2019-10-11 DIAGNOSIS — M419 Scoliosis, unspecified: Secondary | ICD-10-CM

## 2019-10-11 DIAGNOSIS — G8929 Other chronic pain: Secondary | ICD-10-CM | POA: Diagnosis not present

## 2019-10-11 DIAGNOSIS — M545 Low back pain, unspecified: Secondary | ICD-10-CM

## 2019-10-11 MED ORDER — IBUPROFEN 600 MG PO TABS: 600.0000 mg | ORAL_TABLET | Freq: Three times a day (TID) | ORAL | 0 refills | Status: DC | PRN

## 2019-10-11 MED ORDER — ACETAMINOPHEN 500 MG PO TABS: 1000.0000 mg | ORAL_TABLET | Freq: Three times a day (TID) | ORAL | 0 refills | Status: DC | PRN

## 2019-10-11 NOTE — Patient Instructions (Signed)
It was nice to meet you today,  For your pain relief I would like you to the following: -Take the ibuprofen 600 mg every 8 hours as needed. -Take the Tylenol 1000 mg every 8 hours as needed -You can take both at the same time every 8 hours for alternate between taking 2 every 4 hours. -If this does not help with your pain, call us back and we can prescribe a muscle relaxer. -Please use heat to the affected area.  You need heating pad or use the heated up a moist towel.  I have ordered a x-ray of your spine to evaluate your scoliosis.  You can call the [facility] imaging at [place] to make an appointment or you can show up to their office.  Pain does not resolve in 1 month, call us back and reschedule another appointment.  Have a great day,  Frederic Jericho, MD

## 2019-10-11 NOTE — Progress Notes (Signed)
    SUBJECTIVE:   CHIEF COMPLAINT / HPI:   Back pain: has had chronic low back pain.  Earlier this week she had worseening lower back pain on both sides.  The pain radiates down the right leg.  Hasn't taken any medications for it.  She has not taken any medications for it in the past.  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  Patient states she has been told that she has mild scoliosis previously.  Stopped depo in January.  Last time sexually active was seven years ago.     PERTINENT  PMH / PSH: Chronic back pain  OBJECTIVE:   BP 108/62 Comment: Provider informed  Pulse 90   Ht 5\' 7"  (1.702 m)   Wt 151 lb 2 oz (68.5 kg)   SpO2 99%   BMI 23.67 kg/m   General: Tall, thin female.  Appears stated age.  No acute distress. MSK: No tenderness to palpation of the spinal/paraspinal region.  Negative straight leg and/cross leg test.  Full spinal range of motion.  Lateral curvature of the spine noted when patient exhibits forward flexion at the waist.  ASSESSMENT/PLAN:   Chronic bilateral low back pain Chronic issue which was mild enough to not require medications, it is currently worsening over the past week.  Patient cannot think of a trigger such as lifting heavy objects or strenuous exercise.  No signs of sciatica on exam.  Patient has scoliosis most notable when she bends over at the waist.  This may be contributing to her chronic back pain. -Advised patient to alternate ibuprofen and Tylenol for pain relief. -Ordered scoliosis x-ray to evaluate her degree of scoliosis as there are no x-rays in her chart to refer to. -Follow-up if pain not improved in a month.     , MD [facility]

## 2019-10-13 DIAGNOSIS — M545 Low back pain, unspecified: Secondary | ICD-10-CM | POA: Insufficient documentation

## 2019-10-13 DIAGNOSIS — G8929 Other chronic pain: Secondary | ICD-10-CM | POA: Insufficient documentation

## 2019-10-13 DIAGNOSIS — M419 Scoliosis, unspecified: Secondary | ICD-10-CM | POA: Insufficient documentation

## 2019-10-13 NOTE — Assessment & Plan Note (Signed)
Chronic issue which was mild enough to not require medications, it is currently worsening over the past week.  Patient cannot think of a trigger such as lifting heavy objects or strenuous exercise.  No signs of sciatica on exam.  Patient has scoliosis most notable when she bends over at the waist.  This may be contributing to her chronic back pain. -Advised patient to alternate ibuprofen and Tylenol for pain relief. -Ordered scoliosis x-ray to evaluate her degree of scoliosis as there are no x-rays in her chart to refer to. -Follow-up if pain not improved in a month.

## 2019-10-23 ENCOUNTER — Telehealth: Payer: No Typology Code available for payment source | Admitting: Family

## 2019-10-23 DIAGNOSIS — R102 Pelvic and perineal pain: Secondary | ICD-10-CM

## 2019-10-23 NOTE — Progress Notes (Signed)
Based on what you shared with me, I feel your condition warrants further evaluation and I recommend that you be seen for a face to face visit.  Please contact your primary care physician practice to be seen. Many offices offer virtual options to be seen via video if you are not comfortable going in person to a medical facility at this time.  We need to screen your urine for infection and confirmation of bacteria that may be present.   If you do not have a PCP, [place] [facility] offers a free physician referral service available at [phone]. Our trained staff has the experience, knowledge and resources to put you in touch with a physician who is right for you.   You also have the option of a video visit through [email or web address]  If you are having a true medical emergency please call 911.  NOTE: If you entered your credit card information for this eVisit, you will not be charged. You may see a "hold" on your card for the $35 but that hold will drop off and you will not have a charge processed.  Your e-visit answers were reviewed by a board certified advanced clinical practitioner to complete your personal care plan.  Thank you for using e-Visits.

## 2019-10-24 ENCOUNTER — Encounter: Payer: Self-pay | Admitting: Family Medicine

## 2019-10-24 ENCOUNTER — Other Ambulatory Visit: Payer: Self-pay

## 2019-10-24 ENCOUNTER — Ambulatory Visit (INDEPENDENT_AMBULATORY_CARE_PROVIDER_SITE_OTHER): Payer: No Typology Code available for payment source | Admitting: Family Medicine

## 2019-10-24 VITALS — BP 122/78 | HR 84 | Ht 67.0 in | Wt 153.0 lb

## 2019-10-24 DIAGNOSIS — R3915 Urgency of urination: Secondary | ICD-10-CM

## 2019-10-24 DIAGNOSIS — N3001 Acute cystitis with hematuria: Secondary | ICD-10-CM | POA: Diagnosis not present

## 2019-10-24 LAB — POCT URINALYSIS DIP (MANUAL ENTRY)
Bilirubin, UA: NEGATIVE
Glucose, UA: NEGATIVE mg/dL
Ketones, POC UA: NEGATIVE mg/dL
Nitrite, UA: NEGATIVE
Protein Ur, POC: 100 mg/dL — AB
Spec Grav, UA: >=1.03 — AB (ref 1.010–1.025)
Urobilinogen, UA: 0.2 E.U./dL
pH, UA: 5.5 (ref 5.0–8.0)

## 2019-10-24 LAB — POCT UA - MICROSCOPIC ONLY
RBC, urine, microscopic: >20
WBC, Ur, HPF, POC: >20

## 2019-10-24 LAB — POCT URINE PREGNANCY: Preg Test, Ur: NEGATIVE

## 2019-10-24 MED ORDER — CEPHALEXIN 250 MG PO CAPS: 250.0000 mg | ORAL_CAPSULE | Freq: Four times a day (QID) | ORAL | 0 refills | Status: AC

## 2019-10-24 MED FILL — CEPHALEXIN 250 MG CAPSULE: 250 | 5 days supply | Qty: 20 | Fill #0

## 2019-10-24 NOTE — Assessment & Plan Note (Addendum)
UA with large blood, 100 protein, moderate leuks, negative nitrites, >20 WBC and RBC with few bacteria. Given suprapubic pain and UA findings, will empirically treat for UTI and follow up urine culture. No known drug allergies, thus will treat with Keflex 250mg  QID x 5d. RTC if no improvement within 2-3 days, sooner if worsening.

## 2019-10-24 NOTE — Progress Notes (Signed)
   Subjective:   Patient ID: Amy Barrett    DOB: 1990/08/02, 29 y.o. female   MRN: [id number]  Amy Barrett is a 29 y.o. female here for pelvic pressure.  Pelvic Pressure: Patient presenting today with new onset pelvic pressure that she feels at the end of urination starting on Friday.  She also endorses increased frequency, increased urgency, and mild hematuria.  Denies any fevers or chills or flank pain.  Denies dyspareunia, vaginal discharge, vaginal itching, foul odor.  She just recently became sexually active within the last 2 weeks.  She endorses wearing condoms every single time.  She is not currently on birth control.  She was on the Depo for greater than 5 years, she was due for her next injection in January when she opted to stop birth control.  She has not since started her period.  She is not interested in STD testing.  Review of Systems:  Per HPI.   Objective:   BP 122/78   Pulse 84   Ht 5\' 7"  (1.702 m)   Wt 153 lb (69.4 kg)   SpO2 98%   BMI 23.96 kg/m  Vitals and nursing note reviewed.  General: Pleasant young female, sitting comfortably in exam chair, well nourished, well developed, in no acute distress with non-toxic appearance Respiratory: Breathing comfortably on room air, speaking in full sentences Abdomen: soft, non-tender, non-distended, (+) suprapubic pain to palpation Skin: warm, dry Extremities: warm and well perfused, normal tone MSK:  gait normal Neuro: Alert and oriented, speech normal  Assessment & Plan:   Acute cystitis with hematuria UA with large blood, 100 protein, moderate leuks, negative nitrites, >20 WBC and RBC with few bacteria. Given suprapubic pain and UA findings, will empirically treat for UTI and follow up urine culture. No known drug allergies, thus will treat with Keflex 250mg  QID x 5d. RTC if no improvement within 2-3 days, sooner if worsening.  Orders Placed This Encounter  Procedures  . Urine Culture  . POCT urine pregnancy    . POCT urinalysis dipstick  . POCT UA - Microscopic Only   Meds ordered this encounter  Medications  . cephALEXin (KEFLEX) 250 MG capsule    Sig: Take 1 capsule (250 mg total) by mouth 4 (four) times daily for 5 days.    Dispense:  20 capsule    Refill:  0   , DO PGY-2, [facility] 10/24/2019 2:41 PM

## 2019-10-26 LAB — URINE CULTURE

## 2019-11-06 ENCOUNTER — Encounter: Payer: Self-pay | Admitting: Family Medicine

## 2019-11-07 ENCOUNTER — Encounter: Payer: Self-pay | Admitting: Family Medicine

## 2019-11-07 ENCOUNTER — Other Ambulatory Visit: Payer: Self-pay | Admitting: Family Medicine

## 2019-11-07 MED ORDER — BACLOFEN 10 MG PO TABS: 5.0000 mg | ORAL_TABLET | Freq: Three times a day (TID) | ORAL | 0 refills | Status: AC

## 2019-11-07 MED FILL — BACLOFEN 10 MG TABS: 10 | 15 days supply | Qty: 23 | Fill #0

## 2019-11-07 NOTE — Progress Notes (Signed)
Rx for baclofen sent for patient's back pain that is refractory to tylenol and NSAIDs.

## 2019-12-19 LAB — HM PAP SMEAR
HM Pap smear: NEGATIVE
HPV 16/18/45 genotyping: NEGATIVE

## 2019-12-20 ENCOUNTER — Ambulatory Visit
Admission: RE | Admit: 2019-12-20 | Discharge: 2019-12-20 | Disposition: A | Discharge status: Home / Self Care | Payer: No Typology Code available for payment source | Source: Ambulatory Visit | Attending: Family Medicine | Admitting: Family Medicine

## 2019-12-20 DIAGNOSIS — M545 Low back pain, unspecified: Secondary | ICD-10-CM

## 2019-12-21 ENCOUNTER — Encounter: Payer: Self-pay | Admitting: Family Medicine

## 2019-12-24 ENCOUNTER — Other Ambulatory Visit: Payer: Self-pay | Admitting: Family Medicine

## 2019-12-24 DIAGNOSIS — M419 Scoliosis, unspecified: Secondary | ICD-10-CM

## 2019-12-24 NOTE — Progress Notes (Signed)
Patient with pain 2/2 to scoliosis and requesting referral to orthopedics. Will advise patient to schedule appointment in the meantime to assess back pain.

## 2020-01-01 ENCOUNTER — Ambulatory Visit (INDEPENDENT_AMBULATORY_CARE_PROVIDER_SITE_OTHER): Payer: No Typology Code available for payment source | Admitting: Family Medicine

## 2020-01-01 ENCOUNTER — Encounter: Payer: Self-pay | Admitting: Family Medicine

## 2020-01-01 ENCOUNTER — Other Ambulatory Visit: Payer: Self-pay

## 2020-01-01 ENCOUNTER — Other Ambulatory Visit (HOSPITAL_COMMUNITY): Payer: Self-pay | Admitting: Family Medicine

## 2020-01-01 VITALS — BP 92/62 | HR 56 | Wt 151.6 lb

## 2020-01-01 DIAGNOSIS — M419 Scoliosis, unspecified: Secondary | ICD-10-CM

## 2020-01-01 MED ORDER — DULOXETINE HCL 30 MG PO CPEP: ORAL_CAPSULE | ORAL | 3 refills | Status: DC

## 2020-01-01 MED FILL — DULoxetine HCL 30 MG CPEP: 30 | 30 days supply | Qty: 60 | Fill #0

## 2020-01-01 NOTE — Assessment & Plan Note (Signed)
Due to no improvement of symptoms with baclofen, NSAIDs or heating pads, patient is open to trying a different medication to tackle back pain.  - prescribed Cymbalta, 30mg  for 7 days followed by 60mg  daily  - patient to follow up after visit with orthopedic physician, referral previously placed

## 2020-01-01 NOTE — Progress Notes (Signed)
    SUBJECTIVE:   CHIEF COMPLAINT / HPI: scoliosis medication   Scoliosis  Lower Back Pain  Patient reports that she has been having lower back pain for about one year. Patient has had xrays consistent with her diagnosis.  Patient reports that she had no recent injuries or motor vehicle accidents that could have contributed to this lower back pain. She reports she does not have constant lower back pain and notes that her pain is only triggered with flexion sometimes.  Patient completed Oswestry low back pain disability questionnaire with a score indicative of <20% disability. Patient reports that she has no bowel or bladder incontinence, no numbness in her lower extremities or weakness/changes in gait 2/2 to back pain. She reports that she has an appointment later this week with orthopedic surgery for evaluation. Patient states that she does not wish to have surgery and verbalizes that she is open to trying anything to give some relief. Patient has had no prior surgeries on her back and has not delivered in children.   PERTINENT  PMH / PSH:   Scoliosis   OBJECTIVE:   BP 92/62   Pulse (!) 56   Wt 151 lb 9.6 oz (68.8 kg)   SpO2 98%   BMI 23.74 kg/m    General: female appearing stated age in no acute distress Cardio: Normal S1 and S2, no S3 or S4. Rhythm is regular. No murmurs or rubs.  Pulm: Clear to auscultation bilaterally, no crackles, wheezing, or diminished breath sounds. Normal respiratory effort Extremities: No peripheral edema. Warm/ well perfused. Neuro: pt alert and oriented x4  Back: patient demonstrates normal flexion, bi-directional rotation and extension of back, no paraspinal muscle spasms, some curvature noted in spinal canal, mild tenderness to palpation along right infracostal area  ASSESSMENT/PLAN:   Scoliosis Due to no improvement of symptoms with baclofen, NSAIDs or heating pads, patient is open to trying a different medication to tackle back pain.  - prescribed  Cymbalta, 30mg  for 7 days followed by 60mg  daily  - patient to follow up after visit with orthopedic physician, referral previously placed       , MD [facility]

## 2020-01-01 NOTE — Patient Instructions (Addendum)
It was a pleasure to see you today!  Thank you for choosing [facility] for your primary care.  Amy Barrett was seen for scoliosis medication management.   Our plans for today were:  Cymbalta - Please take  once daily for 1 week and then increase to  daily to help with your back pain.   Please follow up with me once you have been able to see your orthopedic doctor.    Best Wishes,   Dr. Neita Garnet   Duloxetine delayed-release capsules What is this medicine? DULOXETINE (doo LOX e teen) is used to treat depression, anxiety, and different types of chronic pain. This medicine may be used for other purposes; ask your health care provider or pharmacist if you have questions. COMMON BRAND NAME(S): Cymbalta, Murrell Converse, Irenka What should I tell my health care provider before I take this medicine? They need to know if you have any of these conditions:  bipolar disorder  glaucoma  high blood pressure  kidney disease  liver disease  seizures  suicidal thoughts, plans or attempt; a previous suicide attempt by you or a family member  take medicines that treat or prevent blood clots  taken medicines called MAOIs like Carbex, Eldepryl, Marplan, Nardil, and Parnate within 14 days  trouble passing urine  an unusual reaction to duloxetine, other medicines, foods, dyes, or preservatives  pregnant or trying to get pregnant  breast-feeding  How should I use this medicine? Take this medicine by mouth with a glass of water. Follow the directions on the prescription label. Do not crush, cut or chew some capsules of this medicine. Some capsules may be opened and sprinkled on applesauce. Check with your doctor or pharmacist if you are not sure. You can take this medicine with or without food. Take your medicine at regular intervals. Do not take your medicine more often than directed. Do not stop taking this medicine suddenly except upon the advice of your doctor.  Stopping this medicine too quickly may cause serious side effects or your condition may worsen. A special MedGuide will be given to you by the pharmacist with each prescription and refill. Be sure to read this information carefully each time. Talk to your pediatrician regarding the use of this medicine in children. While this drug may be prescribed for children as young as 62 years of age for selected conditions, precautions do apply. Overdosage: If you think you have taken too much of this medicine contact a poison control center or emergency room at once. NOTE: This medicine is only for you. Do not share this medicine with others.  What if I miss a dose? If you miss a dose, take it as soon as you can. If it is almost time for your next dose, take only that dose. Do not take double or extra doses.  What may interact with this medicine? Do not take this medicine with any of the following medications:  desvenlafaxine  levomilnacipran  linezolid  MAOIs like Carbex, Eldepryl, Marplan, Nardil, and Parnate  methylene blue (injected into a vein)  milnacipran  thioridazine  venlafaxine This medicine may also interact with the following medications:  alcohol  amphetamines  aspirin and aspirin-like medicines  certain antibiotics like ciprofloxacin and enoxacin  certain medicines for blood pressure, heart disease, irregular heart beat  certain medicines for depression, anxiety, or psychotic disturbances  certain medicines for migraine headache like almotriptan, eletriptan, frovatriptan, naratriptan, rizatriptan, sumatriptan, zolmitriptan  certain medicines that treat or prevent blood clots like  warfarin, enoxaparin, and dalteparin  cimetidine  fentanyl  lithium  NSAIDS, medicines for pain and inflammation, like ibuprofen or naproxen  phentermine  procarbazine  rasagiline  sibutramine  St. John's wort  theophylline  tramadol  tryptophan This list may not  describe all possible interactions. Give your health care provider a list of all the medicines, herbs, non-prescription drugs, or dietary supplements you use. Also tell them if you smoke, drink alcohol, or use illegal drugs. Some items may interact with your medicine. What should I watch for while using this medicine? Tell your doctor if your symptoms do not get better or if they get worse. Visit your doctor or healthcare provider for regular checks on your progress. Because it may take several weeks to see the full effects of this medicine, it is important to continue your treatment as prescribed by your doctor. This medicine may cause serious skin reactions. They can happen weeks to months after starting the medicine. Contact your healthcare provider right away if you notice fevers or flu-like symptoms with a rash. The rash may be red or purple and then turn into blisters or peeling of the skin. Or, you might notice a red rash with swelling of the face, lips, or lymph nodes in your neck or under your arms. Patients and their families should watch out for new or worsening thoughts of suicide or depression. Also watch out for sudden changes in feelings such as feeling anxious, agitated, panicky, irritable, hostile, aggressive, impulsive, severely restless, overly excited and hyperactive, or not being able to sleep. If this happens, especially at the beginning of treatment or after a change in dose, call your healthcare provider. You may get drowsy or dizzy. Do not drive, use machinery, or do anything that needs mental alertness until you know how this medicine affects you. Do not stand or sit up quickly, especially if you are an older patient. This reduces the risk of dizzy or fainting spells. Alcohol may interfere with the effect of this medicine. Avoid alcoholic drinks. This medicine can cause an increase in blood pressure. This medicine can also cause a sudden drop in your blood pressure, which may make you  feel faint and increase the chance of a fall. These effects are most common when you first start the medicine or when the dose is increased, or during use of other medicines that can cause a sudden drop in blood pressure. Check with your doctor for instructions on monitoring your blood pressure while taking this medicine. Your mouth may get dry. Chewing sugarless gum or sucking hard candy, and drinking plenty of water, may help. Contact your doctor if the problem does not go away or is severe. What side effects may I notice from receiving this medicine? Side effects that you should report to your doctor or health care professional as soon as possible:  allergic reactions like skin rash, itching or hives, swelling of the face, lips, or tongue  anxious  breathing problems  confusion  changes in vision  chest pain  confusion  elevated mood, decreased need for sleep, racing thoughts, impulsive behavior  eye pain  fast, irregular heartbeat  feeling faint or lightheaded, falls  feeling agitated, angry, or irritable  hallucination, loss of contact with reality  high blood pressure  loss of balance or coordination  palpitations  redness, blistering, peeling or loosening of the skin, including inside the mouth  restlessness, pacing, inability to keep still  seizures  stiff muscles  suicidal thoughts or other mood  changes  trouble passing urine or change in the amount of urine  trouble sleeping  unusual bleeding or bruising  unusually weak or tired  vomiting  yellowing of the eyes or skin Side effects that usually do not require medical attention (report to your doctor or health care professional if they continue or are bothersome):  change in sex drive or performance  change in appetite or weight  constipation  dizziness  dry mouth  headache  increased sweating  nausea  tired This list may not describe all possible side effects. Call your doctor for  medical advice about side effects. You may report side effects to FDA at 1-800-FDA-1088. Where should I keep my medicine? Keep out of the reach of children. Store at room temperature between 15 and 30 degrees C (59 to 86 degrees F). Throw away any unused medicine after the expiration date. NOTE: This sheet is a summary. It may not cover all possible information. If you have questions about this medicine, talk to your doctor, pharmacist, or health care provider.  2020 Elsevier/Gold Standard (2018-07-20 13:47:50)    Back Exercises The following exercises strengthen the muscles that help to support the trunk and back. They also help to keep the lower back flexible. Doing these exercises can help to prevent back pain or lessen existing pain.  If you have back pain or discomfort, try doing these exercises 2-3 times each day or as told by your health care provider.  As your pain improves, do them once each day, but increase the number of times that you repeat the steps for each exercise (do more repetitions).  To prevent the recurrence of back pain, continue to do these exercises once each day or as told by your health care provider. Do exercises exactly as told by your health care provider and adjust them as directed. It is normal to feel mild stretching, pulling, tightness, or discomfort as you do these exercises, but you should stop right away if you feel sudden pain or your pain gets worse. Exercises Single knee to chest Repeat these steps 3-5 times for each leg: 1. Lie on your back on a firm bed or the floor with your legs extended. 2. Bring one knee to your chest. Your other leg should stay extended and in contact with the floor. 3. Hold your knee in place by grabbing your knee or thigh with both hands and hold. 4. Pull on your knee until you feel a gentle stretch in your lower back or buttocks. 5. Hold the stretch for 10-30 seconds. 6. Slowly release and straighten your leg. Pelvic  tilt Repeat these steps 5-10 times: 1. Lie on your back on a firm bed or the floor with your legs extended. 2. Bend your knees so they are pointing toward the ceiling and your feet are flat on the floor. 3. Tighten your lower abdominal muscles to press your lower back against the floor. This motion will tilt your pelvis so your tailbone points up toward the ceiling instead of pointing to your feet or the floor. 4. With gentle tension and even breathing, hold this position for 5-10 seconds. Cat-cow Repeat these steps until your lower back becomes more flexible: 1. Get into a hands-and-knees position on a firm surface. Keep your hands under your shoulders, and keep your knees under your hips. You may place padding under your knees for comfort. 2. Let your head hang down toward your chest. Contract your abdominal muscles and point your tailbone toward the  floor so your lower back becomes rounded like the back of a cat. 3. Hold this position for 5 seconds. 4. Slowly lift your head, let your abdominal muscles relax and point your tailbone up toward the ceiling so your back forms a sagging arch like the back of a cow. 5. Hold this position for 5 seconds.  Press-ups Repeat these steps 5-10 times: 1. Lie on your abdomen (face-down) on the floor. 2. Place your palms near your head, about shoulder-width apart. 3. Keeping your back as relaxed as possible and keeping your hips on the floor, slowly straighten your arms to raise the top half of your body and lift your shoulders. Do not use your back muscles to raise your upper torso. You may adjust the placement of your hands to make yourself more comfortable. 4. Hold this position for 5 seconds while you keep your back relaxed. 5. Slowly return to lying flat on the floor.  Bridges Repeat these steps 10 times: 1. Lie on your back on a firm surface. 2. Bend your knees so they are pointing toward the ceiling and your feet are flat on the floor. Your arms  should be flat at your sides, next to your body. 3. Tighten your buttocks muscles and lift your buttocks off the floor until your waist is at almost the same height as your knees. You should feel the muscles working in your buttocks and the back of your thighs. If you do not feel these muscles, slide your feet 1-2 inches farther away from your buttocks. 4. Hold this position for 3-5 seconds. 5. Slowly lower your hips to the starting position, and allow your buttocks muscles to relax completely. If this exercise is too easy, try doing it with your arms crossed over your chest. Abdominal crunches Repeat these steps 5-10 times: 1. Lie on your back on a firm bed or the floor with your legs extended. 2. Bend your knees so they are pointing toward the ceiling and your feet are flat on the floor. 3. Cross your arms over your chest. 4. Tip your chin slightly toward your chest without bending your neck. 5. Tighten your abdominal muscles and slowly raise your trunk (torso) high enough to lift your shoulder blades a tiny bit off the floor. Avoid raising your torso higher than that because it can put too much stress on your low back and does not help to strengthen your abdominal muscles. 6. Slowly return to your starting position. Back lifts Repeat these steps 5-10 times: 1. Lie on your abdomen (face-down) with your arms at your sides, and rest your forehead on the floor. 2. Tighten the muscles in your legs and your buttocks. 3. Slowly lift your chest off the floor while you keep your hips pressed to the floor. Keep the back of your head in line with the curve in your back. Your eyes should be looking at the floor. 4. Hold this position for 3-5 seconds. 5. Slowly return to your starting position. Contact a health care provider if:  Your back pain or discomfort gets much worse when you do an exercise.  Your worsening back pain or discomfort does not lessen within 2 hours after you exercise. If you have  any of these problems, stop doing these exercises right away. Do not do them again unless your health care provider says that you can. Get help right away if:  You develop sudden, severe back pain. If this happens, stop doing the exercises right away. Do not  do them again unless your health care provider says that you can. This information is not intended to replace advice given to you by your health care provider. Make sure you discuss any questions you have with your health care provider. Document Revised: 08/24/2018 Document Reviewed: 01/19/2018 Elsevier Patient Education  2020 [employer].

## 2020-01-09 ENCOUNTER — Ambulatory Visit (INDEPENDENT_AMBULATORY_CARE_PROVIDER_SITE_OTHER): Payer: No Typology Code available for payment source | Admitting: Orthopaedic Surgery

## 2020-01-09 ENCOUNTER — Encounter: Payer: Self-pay | Admitting: Orthopaedic Surgery

## 2020-01-09 VITALS — BP 125/72 | HR 60 | Ht 67.0 in | Wt 151.6 lb

## 2020-01-09 DIAGNOSIS — M419 Scoliosis, unspecified: Secondary | ICD-10-CM | POA: Diagnosis not present

## 2020-01-09 DIAGNOSIS — M545 Low back pain, unspecified: Secondary | ICD-10-CM

## 2020-01-09 DIAGNOSIS — G8929 Other chronic pain: Secondary | ICD-10-CM | POA: Diagnosis not present

## 2020-01-09 NOTE — Progress Notes (Signed)
Office Visit Note   Patient: Amy Barrett           Date of Birth: [birth year]           MRN: [id number] Visit Date: 01/09/2020              Requested by: Westley Chandler, MD [address] [place],  [place] [address] PCP: Ronnald Ramp, MD   Assessment & Plan: Visit Diagnoses:  1. Scoliosis of lumbar spine, unspecified scoliosis type   2. Chronic bilateral low back pain without sciatica     Plan: We will set patient up for some physical therapy recheck her in 5 weeks.  I discussed with her no surgery is indicated at this point.  MRI images and report was reviewed.  Follow-Up Instructions: Return in about 5 weeks (around 02/13/2020).   Orders:  Orders Placed This Encounter  Procedures  . Ambulatory referral to Physical Therapy   No orders of the defined types were placed in this encounter.     Procedures: No procedures performed   Clinical Data: No additional findings.   Subjective: Chief Complaint  Patient presents with  . Spine - Pain  . Scoliosis    HPI 29 year old female with diagnosis of scoliosis made last year.  Radiographs 12/20/2019 showed a 24 degree curve from T5-T12 and 37 degree thoracolumbar curve from T11-L4.  She had no congenital hemivertebrae or congenital bars.  Patient states she occasionally has had some back discomfort on both sides.  Sometimes it tends to radiate into her buttocks.  Sometimes bothers her when she is sitting or standing she was given a prescription for Cymbalta but is not started yet.  She took baclofen without improvement.  She is used Tylenol and ibuprofen neither helped.  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  She does not recall a specific injury to her back.  Review of Systems 14 point systems positive for low back pain, scoliosis.  Past history of a ganglion cyst.  Cardiovascular GI negative.   Objective: Vital Signs: BP 125/72   Pulse 60   Ht 5\' 7"  (1.702 m)   Wt  151 lb 9.6 oz (68.8 kg)   BMI 23.74 kg/m   Physical Exam Constitutional:      Appearance: She is well-developed.  HENT:     Head: Normocephalic.     Right Ear: External ear normal.     Left Ear: External ear normal.  Eyes:     Pupils: Pupils are equal, round, and reactive to light.  Neck:     Thyroid: No thyromegaly.     Trachea: No tracheal deviation.  Cardiovascular:     Rate and Rhythm: Normal rate.  Pulmonary:     Effort: Pulmonary effort is normal.  Abdominal:     Palpations: Abdomen is soft.  Skin:    General: Skin is warm and dry.  Neurological:     Mental Status: She is alert and oriented to person, place, and time.  Psychiatric:        Behavior: Behavior normal.     Ortho Exam patient has intact lower extremity reflexes negative straight leg raising negative .  Anterior tib EHL heel and toe walking is normal.  Patient has right thoracic left thoracolumbar curvature.  Pelvis is level.  Normal sensation lower extremities.  Normal reflexes.  Specialty Comments:  No specialty comments available.  Imaging: Narrative & Impression  CLINICAL DATA:  Scoliosis  EXAM: DG SCOLIOSIS  EVAL COMPLETE SPINE 2-3V  COMPARISON:  None.  FINDINGS: 24 degree scoliosis of the thoracic spine from superior endplate of T5 through inferior endplate of T12, apex right. 37 degree scoliosis of the thoracolumbar spine, apex left from superior endplate of T11 to inferior endplate of L4. No congenital vertebral anomalies. 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 Sagittal image demonstrates thoracic kyphosis and lumbar lordosis.  IMPRESSION: Scoliosis of the thoracolumbar spine as above.   Electronically Signed   By: Jasmine Pang M.D.   On: 12/20/2019 22:29      PMFS History: Patient Active Problem List   Diagnosis Date Noted  . Acute cystitis with hematuria 10/24/2019  . Scoliosis 10/13/2019  . Chronic bilateral low back pain 10/13/2019   . Tonsillar tag 07/27/2017  . Ganglion cyst 05/14/2017  . Other insomnia 01/27/2017  . Loss of weight 08/17/2016  . Depo-Provera contraceptive status 03/17/2016  . Eczema 11/01/2014  . Primary dysmenorrhea 09/01/2011   Past Medical History:  Diagnosis Date  . BV (bacterial vaginosis)   . Dysmenorrhea since adolescence   [place] in 2011 that did not show any acute findings. Seen by Dr. Vickey Sages  . Yeast vaginitis     Family History  Problem Relation Age of Onset  . Hypertension Mother   . Breast cancer Maternal Grandmother 16  . Hypertension Maternal Grandmother     History reviewed. No pertinent surgical history. Social History   Occupational History  . Not on file  Tobacco Use  . Smoking status: Former Smoker    Types: Cigars  . Smokeless tobacco: Current User  . Tobacco comment: Black and miles  Vaping Use  . Vaping Use: Never used  Substance and Sexual Activity  . Alcohol use: Yes    Alcohol/week: 0.0 standard drinks    Comment: occasional, social  . Drug use: No  . Sexual activity: Not Currently    Partners: Male    Birth control/protection: Injection    Comment: intercourse age 17, more than 5 sexual partners

## 2020-01-24 ENCOUNTER — Encounter: Payer: Self-pay | Admitting: Rehabilitative and Restorative Service Providers"

## 2020-01-24 ENCOUNTER — Ambulatory Visit: Payer: No Typology Code available for payment source | Admitting: Rehabilitative and Restorative Service Providers"

## 2020-01-24 ENCOUNTER — Other Ambulatory Visit: Payer: Self-pay

## 2020-01-24 DIAGNOSIS — M545 Low back pain: Secondary | ICD-10-CM | POA: Diagnosis not present

## 2020-01-24 DIAGNOSIS — G8929 Other chronic pain: Secondary | ICD-10-CM | POA: Diagnosis not present

## 2020-01-24 DIAGNOSIS — M6281 Muscle weakness (generalized): Secondary | ICD-10-CM | POA: Diagnosis not present

## 2020-01-24 DIAGNOSIS — R293 Abnormal posture: Secondary | ICD-10-CM | POA: Diagnosis not present

## 2020-01-24 NOTE — Patient Instructions (Signed)
Access Code: [email or web address] URL: [email or web address] Date: 01/24/2020 Prepared by: Pauletta Browns  Exercises Standing Lumbar Extension at Wall - Forearms - 5 x daily - 7 x weekly - 1 sets - 5 reps - 3 seconds hold Standing Sidebends - 5 x daily - 7 x weekly - 1 sets - 5 reps - 3 seconds hold Prone Hip Extension - 1 x daily - 7 x weekly - 2-3 sets - 10 reps - 3-10 seconds hold Prone Alternating Arm and Leg Lifts - 1 x daily - 7 x weekly - 2-3 sets - 10 reps - 3-10 seconds hold

## 2020-01-24 NOTE — Therapy (Signed)
[facility] Physical Therapy [address] [place], [place], [address] Phone: [phone]   Fax:  [phone]  Physical Therapy Evaluation  Patient Details  Name: Amy Barrett MRN: [id number] Date of Birth: Apr 18, 1991 Referring Provider (PT): Eldred Manges MD   Encounter Date: 01/24/2020   PT End of Session - 01/24/20 1401    Visit Number 1    Number of Visits 12    PT Start Time 0847    PT Stop Time 0925    PT Time Calculation (min) 38 min    Activity Tolerance Patient tolerated treatment well;No increased pain;Patient limited by fatigue    Behavior During Therapy [facility] for tasks assessed/performed           Past Medical History:  Diagnosis Date   BV (bacterial vaginosis)    Dysmenorrhea since adolescence   [place] in 2011 that did not show any acute findings. Seen by Dr. Karenann Cai vaginitis     History reviewed. No pertinent surgical history.  There were no vitals filed for this visit.    Subjective Assessment - 01/24/20 1358    Subjective Amy Barrett has chronic low back pain.  Worsening recently.    Limitations Sitting;Standing    How long can you sit comfortably? 30 minutes    How long can you stand comfortably? 30 minutes    Patient Stated Goals Reduce low back pain.    Currently in Pain? Yes    Pain Score 5     Pain Location Back    Pain Orientation Lower    Pain Descriptors / Indicators Aching;Sore    Pain Type Chronic pain    Pain Radiating Towards Gluteals    Pain Onset More than a month ago    Pain Frequency Intermittent    Aggravating Factors  Prolonged postures    Pain Relieving Factors Change of position    Effect of Pain on Daily Activities No exercise.    Multiple Pain Sites No              [facility] PT Assessment - 01/24/20 0001      Assessment   Medical Diagnosis Low back pain and scoliosis    Referring Provider (PT) Eldred Manges MD      Balance Screen   Has the patient fallen in the past 6 months No    Has the  patient had a decrease in activity level because of a fear of falling?  No    Is the patient reluctant to leave their home because of a fear of falling?  No      Prior Function   Level of Independence Independent      Cognition   Overall Cognitive Status Within Functional Limits for tasks assessed      ROM / Strength   AROM / PROM / Strength AROM;Strength      AROM   Overall AROM  Deficits    AROM Assessment Site Lumbar    Lumbar Extension 15    Lumbar - Right Side Bend 45    Lumbar - Left Side Bend 30      Strength   Overall Strength Deficits    Strength Assessment Site Lumbar    Lumbar Extension --   10 seconds spine strength test                     Objective measurements completed on examination: See above findings.       [facility] Adult PT  Treatment/Exercise - 01/24/20 0001      Exercises   Exercises Lumbar      Lumbar Exercises: Standing   Other Standing Lumbar Exercises Trunk extension AROM 10X 3 seconds    Other Standing Lumbar Exercises Push hips to R 10X 3 seconds      Lumbar Exercises: Prone   Opposite Arm/Leg Raise Right arm/Left leg;Left arm/Right leg;10 reps;3 seconds    Other Prone Lumbar Exercises Prone alternating hip extensions 10X 3 seconds                  PT Education - 01/24/20 1400    Education Details Discussed imaging, exam findings, reviewed appropriate HEP.  Discussed anatomy, basic posture and body mechanics.    Person(s) Educated Patient    Methods Explanation;Demonstration;Verbal cues;Handout    Comprehension Verbalized understanding;Returned demonstration;Verbal cues required;Need further instruction               PT Long Term Goals - 01/24/20 1405      PT LONG TERM GOAL #1   Title Amy Barrett will report low back pain consistently 0-3/10 on the Numeric Pain Rating Scale.    Baseline 5/10    Time 8    Period Weeks    Status New    Target Date 03/21/20      PT LONG TERM GOAL #2   Title Amy Barrett will report  80%+ self-reported function.    Baseline 50-60%    Time 8    Period Weeks    Status New    Target Date 03/21/20      PT LONG TERM GOAL #3   Title Improve spine extension to 25 degrees and lateral bending to 40 degrees.    Baseline 15 and 30, respectively.    Time 8    Period Weeks    Status New    Target Date 03/21/20      PT LONG TERM GOAL #4   Title Amy Barrett will be able to maintain the spine strength test position for 60 seconds.    Baseline 10 seconds    Time 8    Period Weeks    Status New    Target Date 03/21/20      PT LONG TERM GOAL #5   Title Amy Barrett will be independent with an appropriate HEP at DC.    Time 8    Period Weeks    Target Date 03/21/20                  Plan - 01/24/20 1402    Clinical Impression Statement Amy Barrett has chronic low back pain and a severe L sided scoliosis.  She will benefit from postural education, body mechanics work and core strengthening.    Examination-Activity Limitations Sit;Bend;Lift    Examination-Participation Restrictions Occupation;Community Activity    Stability/Clinical Decision Making Stable/Uncomplicated    Clinical Decision Making Low    Rehab Potential Good    PT Frequency Other (comment)   1-2X/week for ~ 12 visits   PT Duration 8 weeks    PT Treatment/Interventions ADLs/Self Care Home Management;Cryotherapy;Therapeutic activities;Therapeutic exercise;Neuromuscular re-education;Patient/family education    PT Next Visit Plan Core strength and body mechanics work.    PT Home Exercise Plan Access Code: [email or web address]    Consulted and Agree with Plan of Care Patient           Patient will benefit from skilled therapeutic intervention in order to improve the following deficits and impairments:  Decreased activity tolerance, Decreased endurance, Decreased range  of motion, Decreased strength, Postural dysfunction, Improper body mechanics, Pain  Visit Diagnosis: Abnormal posture  Muscle weakness  (generalized)  Chronic bilateral low back pain without sciatica     Problem List Patient Active Problem List   Diagnosis Date Noted   Acute cystitis with hematuria 10/24/2019   Scoliosis 10/13/2019   Chronic bilateral low back pain 10/13/2019   Tonsillar tag 07/27/2017   Ganglion cyst 05/14/2017   Other insomnia 01/27/2017   Loss of weight 08/17/2016   Depo-Provera contraceptive status 03/17/2016   Eczema 11/01/2014   Primary dysmenorrhea 09/01/2011    Amy Barrett PT, MPT 01/24/2020, 2:09 PM  [facility] Physical Therapy [address] [place], [place], [address] Phone: [phone]   Fax:  [phone]  Name: Amy Barrett MRN: [id number] Date of Birth: Nov 19, 1990

## 2020-01-28 ENCOUNTER — Ambulatory Visit: Payer: No Typology Code available for payment source | Admitting: Rehabilitative and Restorative Service Providers"

## 2020-01-28 ENCOUNTER — Encounter: Payer: Self-pay | Admitting: Rehabilitative and Restorative Service Providers"

## 2020-01-28 ENCOUNTER — Other Ambulatory Visit: Payer: Self-pay

## 2020-01-28 DIAGNOSIS — G8929 Other chronic pain: Secondary | ICD-10-CM | POA: Diagnosis not present

## 2020-01-28 DIAGNOSIS — R293 Abnormal posture: Secondary | ICD-10-CM | POA: Diagnosis not present

## 2020-01-28 DIAGNOSIS — M6281 Muscle weakness (generalized): Secondary | ICD-10-CM | POA: Diagnosis not present

## 2020-01-28 DIAGNOSIS — M545 Low back pain: Secondary | ICD-10-CM | POA: Diagnosis not present

## 2020-01-28 NOTE — Therapy (Signed)
[facility] Physical Therapy [address] [place], [place], [address] Phone: [phone]   Fax:  [phone]  Physical Therapy Treatment  Patient Details  Name: Amy Barrett MRN: [id number] Date of Birth: [birth year] Referring Provider (PT): Eldred Manges MD   Encounter Date: 01/28/2020   PT End of Session - 01/28/20 1513    Visit Number 2    Number of Visits 12    Date for PT Re-Evaluation 03/21/20    Progress Note Due on Visit 10    PT Start Time 1515    PT Stop Time 1554    PT Time Calculation (min) 39 min    Activity Tolerance Patient tolerated treatment well    Behavior During Therapy [facility] for tasks assessed/performed           Past Medical History:  Diagnosis Date   BV (bacterial vaginosis)    Dysmenorrhea since adolescence   [place] in 2011 that did not show any acute findings. Seen by Dr. Karenann Cai vaginitis     History reviewed. No pertinent surgical history.  There were no vitals filed for this visit.   Subjective Assessment - 01/28/20 1523    Subjective Pt. indicated no pain today.  Pt. stated some soreness with exercises but nothing much.  Pt. indicated insidious onset of symptoms.    Limitations Sitting;Standing    How long can you sit comfortably? 30 minutes    How long can you stand comfortably? 30 minutes    Patient Stated Goals Reduce low back pain.    Currently in Pain? No/denies    Pain Score 0-No pain    Pain Onset More than a month ago                             [facility] Adult PT Treatment/Exercise - 01/28/20 0001      Lumbar Exercises: Stretches   Lower Trunk Rotation 3 reps   15 sec hold      Lumbar Exercises: Aerobic   Nustep Lvl 6 10 mins      Lumbar Exercises: Supine   Dead Bug 20 reps   bilateral c pelvic floor hold, knees bent at rest   Bridge 15 reps;2 seconds    Other Supine Lumbar Exercises pball press down 10 sec x 10      Lumbar Exercises: Quadruped   Opposite Arm/Leg Raise 10  reps;Left arm/Right leg;Right arm/Left leg                       PT Long Term Goals - 01/24/20 1405      PT LONG TERM GOAL #1   Title Analaya will report low back pain consistently 0-3/10 on the Numeric Pain Rating Scale.    Baseline 5/10    Time 8    Period Weeks    Status New    Target Date 03/21/20      PT LONG TERM GOAL #2   Title Emilina will report 80%+ self-reported function.    Baseline 50-60%    Time 8    Period Weeks    Status New    Target Date 03/21/20      PT LONG TERM GOAL #3   Title Improve spine extension to 25 degrees and lateral bending to 40 degrees.    Baseline 15 and 30, respectively.    Time 8    Period Weeks    Status New  Target Date 03/21/20      PT LONG TERM GOAL #4   Title Lamiya will be able to maintain the spine strength test position for 60 seconds.    Baseline 10 seconds    Time 8    Period Weeks    Status New    Target Date 03/21/20      PT LONG TERM GOAL #5   Title Santana will be independent with an appropriate HEP at DC.    Time 8    Period Weeks    Target Date 03/21/20                 Plan - 01/28/20 1542    Clinical Impression Statement Pt. to benefit from continued intervention to improve strength and motor control from core and hips to improve back symptoms.    Examination-Activity Limitations Sit;Bend;Lift    Examination-Participation Restrictions Occupation;Community Activity    Stability/Clinical Decision Making Stable/Uncomplicated    Rehab Potential Good    PT Frequency Other (comment)   1-2X/week for ~ 12 visits   PT Duration 8 weeks    PT Treatment/Interventions ADLs/Self Care Home Management;Cryotherapy;Therapeutic activities;Therapeutic exercise;Neuromuscular re-education;Patient/family education    PT Next Visit Plan Core strength and body mechanics work.    PT Home Exercise Plan Access Code: [email or web address]    Consulted and Agree with Plan of Care Patient           Patient will benefit from  skilled therapeutic intervention in order to improve the following deficits and impairments:  Decreased activity tolerance, Decreased endurance, Decreased range of motion, Decreased strength, Postural dysfunction, Improper body mechanics, Pain  Visit Diagnosis: Chronic bilateral low back pain without sciatica  Abnormal posture  Muscle weakness (generalized)     Problem List Patient Active Problem List   Diagnosis Date Noted   Acute cystitis with hematuria 10/24/2019   Scoliosis 10/13/2019   Chronic bilateral low back pain 10/13/2019   Tonsillar tag 07/27/2017   Ganglion cyst 05/14/2017   Other insomnia 01/27/2017   Loss of weight 08/17/2016   Depo-Provera contraceptive status 03/17/2016   Eczema 11/01/2014   Primary dysmenorrhea 09/01/2011    Chyrel Masson, PT, DPT, OCS, ATC 01/28/20  3:48 PM    [facility] Physical Therapy [address] [place], [place], [address] Phone: [phone]   Fax:  [phone]  Name: Amy Barrett MRN: [id number] Date of Birth: 04-02-1991

## 2020-01-29 ENCOUNTER — Encounter: Payer: Self-pay | Admitting: Family Medicine

## 2020-02-03 ENCOUNTER — Encounter: Payer: Self-pay | Admitting: Family Medicine

## 2020-02-08 ENCOUNTER — Encounter: Payer: Self-pay | Admitting: Rehabilitative and Restorative Service Providers"

## 2020-02-08 ENCOUNTER — Other Ambulatory Visit: Payer: Self-pay

## 2020-02-08 ENCOUNTER — Ambulatory Visit (INDEPENDENT_AMBULATORY_CARE_PROVIDER_SITE_OTHER): Payer: No Typology Code available for payment source | Admitting: Rehabilitative and Restorative Service Providers"

## 2020-02-08 DIAGNOSIS — M6281 Muscle weakness (generalized): Secondary | ICD-10-CM

## 2020-02-08 DIAGNOSIS — R293 Abnormal posture: Secondary | ICD-10-CM

## 2020-02-08 NOTE — Therapy (Signed)
[facility] Physical Therapy [address] [place], [place], [address] Phone: [phone]   Fax:  [phone]  Physical Therapy Treatment  Patient Details  Name: Amy Barrett MRN: [id number] Date of Birth: 1990-12-29 Referring Provider (PT): Eldred Manges MD   Encounter Date: 02/08/2020   PT End of Session - 02/08/20 1633    Visit Number 3    Number of Visits 12    Date for PT Re-Evaluation 03/21/20    Progress Note Due on Visit 10    PT Start Time 1156    PT Stop Time 1234    PT Time Calculation (min) 38 min    Activity Tolerance Patient tolerated treatment well;No increased pain;Patient limited by fatigue    Behavior During Therapy [facility] for tasks assessed/performed           Past Medical History:  Diagnosis Date  . BV (bacterial vaginosis)   . Dysmenorrhea since adolescence   [place] in 2011 that did not show any acute findings. Seen by Dr. Vickey Sages  . Yeast vaginitis     History reviewed. No pertinent surgical history.  There were no vitals filed for this visit.   Subjective Assessment - 02/08/20 1630    Subjective Amy Barrett reports poor compliance this week as she has been working 4 12 hour shifts at work.    Limitations Sitting;Standing    How long can you sit comfortably? 30 minutes    How long can you stand comfortably? 30 minutes    Patient Stated Goals Reduce low back pain.    Currently in Pain? Yes    Pain Score 3     Pain Location Back    Pain Orientation Lower    Pain Descriptors / Indicators Aching;Dull;Sore    Pain Type Chronic pain    Pain Radiating Towards Gluteals    Pain Onset More than a month ago    Pain Frequency Intermittent    Aggravating Factors  Prolonged postures, particularly sitting.    Pain Relieving Factors Change of position.    Effect of Pain on Daily Activities No exercise.    Multiple Pain Sites No                             [facility] Adult PT Treatment/Exercise - 02/08/20 0001      Exercises    Exercises Lumbar      Lumbar Exercises: Standing   Lifting Other (comment)   Hip hiking 2 sets of 10 for 3 seconds   Other Standing Lumbar Exercises Trunk extension AROM 10X 3 seconds    Other Standing Lumbar Exercises Push hips to R 10X 3 seconds      Lumbar Exercises: Prone   Opposite Arm/Leg Raise Right arm/Left leg;Left arm/Right leg;10 reps;5 seconds   2 sets   Other Prone Lumbar Exercises Prone B arm and leg extensions (Superwoman) 2 sets of 5 for 3 seconds                  PT Education - 02/08/20 1632    Education Details Reviewed HEP with addition of hip hiking for hip abductors and quadratus lumborum strengthening.    Person(s) Educated Patient    Methods Explanation;Demonstration;Verbal cues;Handout    Comprehension Verbalized understanding;Returned demonstration;Verbal cues required;Need further instruction               PT Long Term Goals - 02/08/20 1632      PT LONG TERM GOAL #  1   Title Amy Barrett will report low back pain consistently 0-3/10 on the Numeric Pain Rating Scale.    Baseline 5/10    Time 8    Period Weeks    Status On-going      PT LONG TERM GOAL #2   Title Amy Barrett will report 80%+ self-reported function.    Baseline 50-60%    Time 8    Period Weeks    Status On-going      PT LONG TERM GOAL #3   Title Improve spine extension to 25 degrees and lateral bending to 40 degrees.    Baseline 15 and 30, respectively.    Time 8    Period Weeks    Status On-going      PT LONG TERM GOAL #4   Title Amy Barrett will be able to maintain the spine strength test position for 60 seconds.    Baseline 10 seconds    Time 8    Period Weeks    Status On-going      PT LONG TERM GOAL #5   Title Amy Barrett will be independent with an appropriate HEP at DC.    Time 8    Period Weeks    Status On-going                 Plan - 02/08/20 1634    Clinical Impression Statement Amy Barrett reported her HEP compliance suffered this week with working 12 hour shifts.   Returning to her previous HEP compliance will help with her pain with prolonged sitting.  Added 1 activity to address this and reviewed her previous program with corrections as needed.    Examination-Activity Limitations Sit;Bend;Lift    Examination-Participation Restrictions Occupation;Community Activity    Stability/Clinical Decision Making Stable/Uncomplicated    Rehab Potential Good    PT Frequency Other (comment)   1-2X/week for ~ 12 visits   PT Duration 8 weeks    PT Treatment/Interventions ADLs/Self Care Home Management;Cryotherapy;Therapeutic activities;Therapeutic exercise;Neuromuscular re-education;Patient/family education    PT Next Visit Plan Core strength and body mechanics work.    PT Home Exercise Plan Access Code: [email or web address]  Access Code: [email or web address]    Consulted and Agree with Plan of Care Patient           Patient will benefit from skilled therapeutic intervention in order to improve the following deficits and impairments:  Decreased activity tolerance, Decreased endurance, Decreased range of motion, Decreased strength, Postural dysfunction, Improper body mechanics, Pain  Visit Diagnosis: Abnormal posture  Muscle weakness (generalized)     Problem List Patient Active Problem List   Diagnosis Date Noted  . Acute cystitis with hematuria 10/24/2019  . Scoliosis 10/13/2019  . Chronic bilateral low back pain 10/13/2019  . Tonsillar tag 07/27/2017  . Ganglion cyst 05/14/2017  . Other insomnia 01/27/2017  . Loss of weight 08/17/2016  . Depo-Provera contraceptive status 03/17/2016  . Eczema 11/01/2014  . Primary dysmenorrhea 09/01/2011    Amy Barrett PT, MPT 02/08/2020, 4:36 PM  [facility] Physical Therapy [address] [place], [place], [address] Phone: [phone]   Fax:  [phone]  Name: Amy Barrett MRN: [id number] Date of Birth: 1991-01-01

## 2020-02-08 NOTE — Patient Instructions (Signed)
Access Code: [email or web address] URL: [email or web address] Date: 02/08/2020 Prepared by: Pauletta Browns  Exercises Prone Bilateral Arm and Leg Lift - 1 x daily - 7 x weekly - 2 sets - 10 reps - 3 seconds hold Standing Hip Hiking - 2 x daily - 7 x weekly - 2 sets - 10 reps - 3 seconds hold

## 2020-02-12 ENCOUNTER — Encounter: Payer: No Typology Code available for payment source | Admitting: Rehabilitative and Restorative Service Providers"

## 2020-02-18 ENCOUNTER — Encounter: Payer: Self-pay | Admitting: Rehabilitative and Restorative Service Providers"

## 2020-02-18 ENCOUNTER — Other Ambulatory Visit: Payer: Self-pay

## 2020-02-18 ENCOUNTER — Ambulatory Visit (INDEPENDENT_AMBULATORY_CARE_PROVIDER_SITE_OTHER): Payer: No Typology Code available for payment source | Admitting: Rehabilitative and Restorative Service Providers"

## 2020-02-18 DIAGNOSIS — R293 Abnormal posture: Secondary | ICD-10-CM | POA: Diagnosis not present

## 2020-02-18 DIAGNOSIS — M545 Low back pain, unspecified: Secondary | ICD-10-CM

## 2020-02-18 DIAGNOSIS — G8929 Other chronic pain: Secondary | ICD-10-CM | POA: Diagnosis not present

## 2020-02-18 DIAGNOSIS — M6281 Muscle weakness (generalized): Secondary | ICD-10-CM | POA: Diagnosis not present

## 2020-02-18 NOTE — Patient Instructions (Signed)
Access Code: [email or web address] URL: [email or web address] Date: 02/18/2020 Prepared by: Pauletta Browns  Exercises Standing Scapular Retraction - 5 x daily - 7 x weekly - 1 sets - 5 reps - 5 second hold

## 2020-02-18 NOTE — Therapy (Signed)
[facility] Physical Therapy [address] [place], [place], [address] Phone: [phone]   Fax:  [phone]  Physical Therapy Treatment  Patient Details  Name: Amy Barrett MRN: [id number] Date of Birth: April 03, 1991 Referring Provider (PT): Eldred Manges MD   Encounter Date: 02/18/2020   PT End of Session - 02/18/20 0847    Visit Number 4    Number of Visits 12    Date for PT Re-Evaluation 03/21/20    Progress Note Due on Visit 10    PT Start Time 0758    PT Stop Time 0838    PT Time Calculation (min) 40 min    Activity Tolerance Patient tolerated treatment well;No increased pain;Patient limited by fatigue    Behavior During Therapy [facility] for tasks assessed/performed           Past Medical History:  Diagnosis Date  . BV (bacterial vaginosis)   . Dysmenorrhea since adolescence   [place] in 2011 that did not show any acute findings. Seen by Dr. Vickey Sages  . Yeast vaginitis     History reviewed. No pertinent surgical history.  There were no vitals filed for this visit.   Subjective Assessment - 02/18/20 0844    Subjective Amy Barrett reports poor compliance this week as she continues to work 4 12 hour shifts at work.    Limitations Sitting;Standing    How long can you sit comfortably? 30 minutes    How long can you stand comfortably? 30 minutes    Patient Stated Goals Reduce low back pain.    Currently in Pain? Yes    Pain Score 6     Pain Location Back    Pain Orientation Lower    Pain Descriptors / Indicators Aching;Dull;Sore    Pain Type Chronic pain    Pain Radiating Towards Mostly in back    Pain Onset More than a month ago    Pain Frequency Intermittent    Aggravating Factors  Late in the day and with prolonged postures    Pain Relieving Factors Change of position    Effect of Pain on Daily Activities No exercise    Multiple Pain Sites No                             [facility] Adult PT Treatment/Exercise - 02/18/20 0001       Exercises   Exercises Lumbar      Lumbar Exercises: Machines for Strengthening   Other Lumbar Machine Exercise Pull to chest 45# 15X      Lumbar Exercises: Standing   Lifting Other (comment)   Hip hiking 2 sets of 10 for 3 seconds & 1 set of 5 with pelv   Row Strengthening;10 reps   5 seconds shoulder blade pinch   Other Standing Lumbar Exercises Trunk extension AROM 10X 3 seconds    Other Standing Lumbar Exercises Push hips to R 10X 3 seconds      Lumbar Exercises: Prone   Opposite Arm/Leg Raise Right arm/Left leg;Left arm/Right leg;10 reps;5 seconds   2 sets   Other Prone Lumbar Exercises Prone B arm and leg extensions (Superwoman) 2 sets of 5 for 8 seconds    Other Prone Lumbar Exercises Prone leg extensions with thumbs up at 90 degrees 10X 3 seconds                  PT Education - 02/18/20 0846    Education Details Reviewed and  progressed HEP.  Reinforced the importance of prone strengthening.    Person(s) Educated Patient    Methods Explanation;Demonstration;Verbal cues;Handout    Comprehension Verbal cues required;Need further instruction;Returned demonstration;Verbalized understanding               PT Long Term Goals - 02/18/20 0846      PT LONG TERM GOAL #1   Title Amy Barrett will report low back pain consistently 0-3/10 on the Numeric Pain Rating Scale.    Baseline 5/10    Time 8    Period Weeks    Status On-going      PT LONG TERM GOAL #2   Title Amy Barrett will report 80%+ self-reported function.    Baseline 50-60%    Time 8    Period Weeks    Status On-going      PT LONG TERM GOAL #3   Title Improve spine extension to 25 degrees and lateral bending to 40 degrees.    Baseline 15 and 30, respectively.    Time 8    Period Weeks    Status On-going      PT LONG TERM GOAL #4   Title Amy Barrett will be able to maintain the spine strength test position for 60 seconds.    Baseline 10 seconds    Time 8    Period Weeks    Status On-going      PT LONG TERM  GOAL #5   Title Amy Barrett will be independent with an appropriate HEP at DC.    Time 8    Period Weeks    Status On-going                 Plan - 02/18/20 0847    Clinical Impression Statement Amy Barrett reports good compliance with standing exercises and poor compliance with prone strengthening due to 4-5 12 hour work days.  She has more time off this week and is motivated to improve compliance as she gets "complete pain relief" after prone strengthening.  With continue compliance and strength gains, her prognosis to decrease pain and improve comfortable function is good.    Examination-Activity Limitations Sit;Bend;Lift    Examination-Participation Restrictions Occupation;Community Activity    Stability/Clinical Decision Making Stable/Uncomplicated    Rehab Potential Good    PT Frequency Other (comment)   1-2X/week for ~ 12 visits   PT Duration 8 weeks    PT Treatment/Interventions ADLs/Self Care Home Management;Cryotherapy;Therapeutic activities;Therapeutic exercise;Neuromuscular re-education;Patient/family education    PT Next Visit Plan Core strength and body mechanics work.    PT Home Exercise Plan Access Code: [email or web address]  Access Code: [email or web address]    Consulted and Agree with Plan of Care Patient           Patient will benefit from skilled therapeutic intervention in order to improve the following deficits and impairments:  Decreased activity tolerance, Decreased endurance, Decreased range of motion, Decreased strength, Postural dysfunction, Improper body mechanics, Pain  Visit Diagnosis: Abnormal posture  Muscle weakness (generalized)  Chronic bilateral low back pain without sciatica     Problem List Patient Active Problem List   Diagnosis Date Noted  . Acute cystitis with hematuria 10/24/2019  . Scoliosis 10/13/2019  . Chronic bilateral low back pain 10/13/2019  . Tonsillar tag 07/27/2017  . Ganglion cyst 05/14/2017  . Other insomnia 01/27/2017  . Loss of weight  08/17/2016  . Depo-Provera contraceptive status 03/17/2016  . Eczema 11/01/2014  . Primary dysmenorrhea 09/01/2011    Cherlyn Cushing PT, MPT 02/18/2020, 8:50  AM  [facility] Physical Therapy [address] [place], [place], [address] Phone: [phone]   Fax:  [phone]  Name: Amy Barrett MRN: [id number] Date of Birth: 02-Jul-1990

## 2020-02-25 ENCOUNTER — Encounter: Payer: No Typology Code available for payment source | Admitting: Rehabilitative and Restorative Service Providers"

## 2020-02-27 ENCOUNTER — Encounter: Payer: Self-pay | Admitting: Rehabilitative and Restorative Service Providers"

## 2020-02-27 ENCOUNTER — Encounter: Payer: Self-pay | Admitting: Family Medicine

## 2020-02-27 ENCOUNTER — Other Ambulatory Visit: Payer: Self-pay

## 2020-02-27 ENCOUNTER — Other Ambulatory Visit: Payer: Self-pay | Admitting: Family Medicine

## 2020-02-27 ENCOUNTER — Other Ambulatory Visit (HOSPITAL_COMMUNITY): Payer: Self-pay | Admitting: Family Medicine

## 2020-02-27 ENCOUNTER — Ambulatory Visit: Payer: No Typology Code available for payment source | Admitting: Rehabilitative and Restorative Service Providers"

## 2020-02-27 DIAGNOSIS — M6281 Muscle weakness (generalized): Secondary | ICD-10-CM

## 2020-02-27 DIAGNOSIS — R293 Abnormal posture: Secondary | ICD-10-CM

## 2020-02-27 DIAGNOSIS — G8929 Other chronic pain: Secondary | ICD-10-CM

## 2020-02-27 DIAGNOSIS — M545 Low back pain, unspecified: Secondary | ICD-10-CM | POA: Diagnosis not present

## 2020-02-27 MED ORDER — HYDROXYZINE HCL 50 MG PO TABS: 50.0000 mg | ORAL_TABLET | Freq: Every evening | ORAL | 1 refills | Status: DC | PRN

## 2020-02-27 MED ORDER — HYDROXYZINE HCL 10 MG PO TABS: 10.0000 mg | ORAL_TABLET | Freq: Every evening | ORAL | 1 refills | Status: DC | PRN

## 2020-02-27 MED FILL — hydrOXYzine HCL 50 MG TABS: 50 | 60 days supply | Qty: 60 | Fill #0

## 2020-02-27 NOTE — Therapy (Signed)
[facility] Physical Therapy [address] [place], [place], [address] Phone: [phone]   Fax:  [phone]  Physical Therapy Treatment  Patient Details  Name: Amy Barrett MRN: [id number] Date of Birth: 06-Jan-1991 Referring Provider (PT): Eldred Manges MD   Encounter Date: 02/27/2020   PT End of Session - 02/27/20 1342    Visit Number 5    Number of Visits 12    Date for PT Re-Evaluation 03/21/20    Progress Note Due on Visit 10    PT Start Time 1301    PT Stop Time 1341    PT Time Calculation (min) 40 min    Activity Tolerance Patient tolerated treatment well;No increased pain;Patient limited by fatigue    Behavior During Therapy [facility] for tasks assessed/performed           Past Medical History:  Diagnosis Date  . BV (bacterial vaginosis)   . Dysmenorrhea since adolescence   [place] in 2011 that did not show any acute findings. Seen by Dr. Vickey Sages  . Yeast vaginitis     History reviewed. No pertinent surgical history.  There were no vitals filed for this visit.   Subjective Assessment - 02/27/20 1340    Subjective Compliance continues to be the biggest impediment to progress.    Limitations Sitting;Standing    How long can you sit comfortably? 30 minutes    How long can you stand comfortably? 30 minutes    Patient Stated Goals Reduce low back pain.    Currently in Pain? Yes    Pain Score 5     Pain Location Back    Pain Orientation Lower    Pain Descriptors / Indicators Aching;Dull;Sore    Pain Type Chronic pain    Pain Onset More than a month ago    Pain Frequency Intermittent    Aggravating Factors  Late day and prolonged postures    Pain Relieving Factors Change of position    Effect of Pain on Daily Activities Limits exercise for recreation    Multiple Pain Sites No                             [facility] Adult PT Treatment/Exercise - 02/27/20 0001      Exercises   Exercises Lumbar      Lumbar Exercises: Machines for  Strengthening   Other Lumbar Machine Exercise Pull to chest 55# 2 sets of 10X      Lumbar Exercises: Standing   Lifting Other (comment)   Hip hiking 1 set of 10 for 3 seconds & 2 sets of 5 c push   Row Strengthening;10 reps   5 seconds shoulder blade pinch   Other Standing Lumbar Exercises Trunk extension AROM 10X 3 seconds    Other Standing Lumbar Exercises Push hips to R 10X 3 seconds      Lumbar Exercises: Prone   Opposite Arm/Leg Raise Right arm/Left leg;Left arm/Right leg;10 reps;5 seconds   2 sets   Other Prone Lumbar Exercises Prone B arm and leg extensions (Superwoman) 3 sets of 5X for 10 seconds    Other Prone Lumbar Exercises Prone leg extensions with thumbs up at 90 degrees 10X 3 seconds                  PT Education - 02/27/20 1341    Education Details Reviewed HEP and reinforced the importance of consistent compliance.    Person(s) Educated Patient  Methods Explanation;Demonstration;Verbal cues    Comprehension Verbalized understanding;Returned demonstration;Need further instruction;Verbal cues required               PT Long Term Goals - 02/27/20 1342      PT LONG TERM GOAL #1   Title Chauna will report low back pain consistently 0-3/10 on the Numeric Pain Rating Scale.    Baseline 5/10    Time 8    Period Weeks    Status On-going      PT LONG TERM GOAL #2   Title [name] will report 80%+ self-reported function.    Baseline 50-60%    Time 8    Period Weeks    Status On-going      PT LONG TERM GOAL #3   Title Improve spine extension to 25 degrees and lateral bending to 40 degrees.    Baseline 15 and 30, respectively.    Time 8    Period Weeks    Status On-going      PT LONG TERM GOAL #4   Title [name] will be able to maintain the spine strength test position for 60 seconds.    Baseline 10 seconds    Time 8    Period Weeks    Status On-going      PT LONG TERM GOAL #5   Title [name] will be independent with an appropriate HEP at DC.     Time 8    Period Weeks    Status On-going                 Plan - 02/27/20 1342    Clinical Impression Statement Terease reports better compliance with walking although prone strengthening compliance remains poor.  I recommended she make time at the same time everyday to do her prone exercises before work and on her days off to set a routine and help improve compliance.  With good HEP compliance her prognosis is good.    Examination-Activity Limitations Sit;Bend;Lift    Examination-Participation Restrictions Occupation;Community Activity    Stability/Clinical Decision Making Stable/Uncomplicated    Rehab Potential Good    PT Frequency Other (comment)   1-2X/week for ~ 12 visits   PT Duration 8 weeks    PT Treatment/Interventions ADLs/Self Care Home Management;Cryotherapy;Therapeutic activities;Therapeutic exercise;Neuromuscular re-education;Patient/family education    PT Next Visit Plan Core strength and body mechanics work.    PT Home Exercise Plan Access Code: [email or web address]  Access Code: [email or web address]    Consulted and Agree with Plan of Care Patient           Patient will benefit from skilled therapeutic intervention in order to improve the following deficits and impairments:  Decreased activity tolerance, Decreased endurance, Decreased range of motion, Decreased strength, Postural dysfunction, Improper body mechanics, Pain  Visit Diagnosis: Abnormal posture  Muscle weakness (generalized)  Chronic bilateral low back pain without sciatica     Problem List Patient Active Problem List   Diagnosis Date Noted  . Acute cystitis with hematuria 10/24/2019  . Scoliosis 10/13/2019  . Chronic bilateral low back pain 10/13/2019  . Tonsillar tag 07/27/2017  . Ganglion cyst 05/14/2017  . Other insomnia 01/27/2017  . Loss of weight 08/17/2016  . Depo-Provera contraceptive status 03/17/2016  . Eczema 11/01/2014  . Primary dysmenorrhea 09/01/2011    Amy Barrett PT, MPT 02/27/2020,  1:45 PM  [facility] Physical Therapy [address] [place], [place], [address] Phone: [phone]   Fax:  [phone]  Name: Amy Barrett MRN: [id number]  Date of Birth: [birth year]

## 2020-02-27 NOTE — Progress Notes (Signed)
Rx strength updated to 50mg  and resent to pharmacy.

## 2020-02-27 NOTE — Progress Notes (Signed)
Rx for hydroxyzine 50mg  to be used at bedtime sent to pharmacy per patient request.

## 2020-03-04 ENCOUNTER — Encounter: Payer: Self-pay | Admitting: Family Medicine

## 2020-03-04 ENCOUNTER — Encounter: Payer: No Typology Code available for payment source | Admitting: Rehabilitative and Restorative Service Providers"

## 2020-03-05 ENCOUNTER — Encounter: Payer: No Typology Code available for payment source | Admitting: Rehabilitative and Restorative Service Providers"

## 2020-03-10 ENCOUNTER — Encounter: Payer: No Typology Code available for payment source | Admitting: Rehabilitative and Restorative Service Providers"

## 2020-03-12 ENCOUNTER — Encounter: Payer: No Typology Code available for payment source | Admitting: Rehabilitative and Restorative Service Providers"

## 2020-03-14 ENCOUNTER — Other Ambulatory Visit (HOSPITAL_COMMUNITY): Payer: Self-pay | Admitting: Obstetrics and Gynecology

## 2020-03-14 MED FILL — IBUPROFEN 800 MG TAB: 800 | 10 days supply | Qty: 30 | Fill #0

## 2020-03-18 ENCOUNTER — Encounter: Payer: Self-pay | Admitting: Family Medicine

## 2020-03-19 ENCOUNTER — Ambulatory Visit: Payer: No Typology Code available for payment source | Admitting: Rehabilitative and Restorative Service Providers"

## 2020-03-19 ENCOUNTER — Encounter: Payer: Self-pay | Admitting: Rehabilitative and Restorative Service Providers"

## 2020-03-19 ENCOUNTER — Other Ambulatory Visit: Payer: Self-pay

## 2020-03-19 DIAGNOSIS — M6281 Muscle weakness (generalized): Secondary | ICD-10-CM

## 2020-03-19 DIAGNOSIS — M545 Low back pain, unspecified: Secondary | ICD-10-CM | POA: Diagnosis not present

## 2020-03-19 DIAGNOSIS — R293 Abnormal posture: Secondary | ICD-10-CM | POA: Diagnosis not present

## 2020-03-19 DIAGNOSIS — G8929 Other chronic pain: Secondary | ICD-10-CM | POA: Diagnosis not present

## 2020-03-19 NOTE — Patient Instructions (Signed)
Get in the routine of doing 2 prone exercises every morning and 3 standing throughout the course of the day.  Follow-up in a month.

## 2020-03-19 NOTE — Therapy (Addendum)
[facility] Physical Therapy [address] [place], [place], [address] Phone: [phone]   Fax:  [phone]  Physical Therapy Treatment  Patient Details  Name: Amy Barrett MRN: [id number] Date of Birth: 1991/01/11 Referring Provider (PT): Marybelle Killings MD  PHYSICAL THERAPY DISCHARGE SUMMARY  Visits from Start of Care: 6  Current functional level related to goals / functional outcomes: See note   Remaining deficits: Related to compliance, will be minimal with consistent HEP compliance   Education / Equipment: HEP Plan:                                                    Patient goals were partially met. Patient is being discharged due to not returning since the last visit.  ?????     Encounter Date: 03/19/2020   PT End of Session - 03/19/20 1343    Visit Number 6    Number of Visits 12    Date for PT Re-Evaluation 03/21/20    Progress Note Due on Visit 10    PT Start Time 1300    PT Stop Time 1338    PT Time Calculation (min) 38 min    Activity Tolerance Patient tolerated treatment well;No increased pain;Patient limited by fatigue    Behavior During Therapy [facility] for tasks assessed/performed           Past Medical History:  Diagnosis Date  . BV (bacterial vaginosis)   . Dysmenorrhea since adolescence   [place] in 2011 that did not show any acute findings. Seen by Dr. Orvan Seen  . Yeast vaginitis     History reviewed. No pertinent surgical history.  There were no vitals filed for this visit.   Subjective Assessment - 03/19/20 1340    Subjective Compliance continues to be the biggest impediment to progress.    Limitations Sitting;Standing    How long can you sit comfortably? 60 minutes (was 30)    How long can you stand comfortably? 60 minutes (was 30)    Patient Stated Goals Reduce low back pain.    Currently in Pain? Yes    Pain Score 3     Pain Location Back    Pain Orientation Lower    Pain Descriptors / Indicators Aching    Pain Type  Chronic pain    Pain Radiating Towards Back and occasional gluteal    Pain Onset More than a month ago    Pain Frequency Intermittent    Aggravating Factors  Late in day and with prolonged postures (mostly sitting)    Pain Relieving Factors Change of position    Effect of Pain on Daily Activities Limited endurance with ADLs    Multiple Pain Sites No                             [facility] Adult PT Treatment/Exercise - 03/19/20 0001      Therapeutic Activites    Therapeutic Activities Other Therapeutic Activities   Imaging review, discussion of body mechanics and routine wit     Exercises   Exercises Lumbar      Lumbar Exercises: Standing   Row Strengthening;10 reps   5 seconds shoulder blade pinch   Other Standing Lumbar Exercises Trunk extension AROM 10X 3 seconds    Other Standing Lumbar Exercises Push  hips to R 10X 3 seconds      Lumbar Exercises: Prone   Opposite Arm/Leg Raise Right arm/Left leg;Left arm/Right leg;10 reps;5 seconds   2 sets   Other Prone Lumbar Exercises Prone B arm and leg extensions (Superwoman) 3 sets of 5X for 10 seconds                  PT Education - 03/19/20 1342    Education Details See patient instructions.    Person(s) Educated Patient    Methods Explanation;Demonstration;Verbal cues    Comprehension Returned demonstration;Need further instruction;Verbal cues required;Verbalized understanding               PT Long Term Goals - 03/19/20 1343      PT LONG TERM GOAL #1   Title [name] will report low back pain consistently 0-3/10 on the Numeric Pain Rating Scale.    Baseline 5/10    Time 8    Period Weeks    Status On-going      PT LONG TERM GOAL #2   Title [name] will report 80%+ self-reported function.    Baseline 50-60%    Time 8    Period Weeks    Status On-going      PT LONG TERM GOAL #3   Title Improve spine extension to 25 degrees and lateral bending to 40 degrees.    Baseline 15 and 30, respectively.     Time 8    Period Weeks    Status On-going      PT LONG TERM GOAL #4   Title [name] will be able to maintain the spine strength test position for 60 seconds.    Baseline 14 seconds (was 10 seconds)    Time 8    Period Weeks    Status On-going      PT LONG TERM GOAL #5   Title [name] will be independent with an appropriate HEP at DC.    Time 8    Period Weeks    Status On-going                 Plan - 03/19/20 1344    Clinical Impression Statement Ohanna reports 2X/week compliance (daily recommended).  Some progress is noted with sitting and standing endurance although HEP compliance is limiting progress.  Reviewed education, imaging and her HEP (which takes ~ 15 minutes to complete as prescribed at home).  Follow-up RA in a month.    Examination-Activity Limitations Sit;Bend;Lift    Examination-Participation Restrictions Occupation;Community Activity    Stability/Clinical Decision Making Stable/Uncomplicated    Rehab Potential Good    PT Frequency Other (comment)   1-2X/week for ~ 12 visits   PT Duration 8 weeks    PT Treatment/Interventions ADLs/Self Care Home Management;Cryotherapy;Therapeutic activities;Therapeutic exercise;Neuromuscular re-education;Patient/family education    PT Next Visit Plan RA    PT Home Exercise Plan Access Code: [email or web address].  Access Code: [email or web address]    Consulted and Agree with Plan of Care Patient           Patient will benefit from skilled therapeutic intervention in order to improve the following deficits and impairments:  Decreased activity tolerance, Decreased endurance, Decreased range of motion, Decreased strength, Postural dysfunction, Improper body mechanics, Pain  Visit Diagnosis: Abnormal posture  Muscle weakness (generalized)  Chronic bilateral low back pain without sciatica     Problem List Patient Active Problem List   Diagnosis Date Noted  . Acute cystitis with hematuria 10/24/2019  . Scoliosis 10/13/2019  .  Chronic  bilateral low back pain 10/13/2019  . Tonsillar tag 07/27/2017  . Ganglion cyst 05/14/2017  . Other insomnia 01/27/2017  . Loss of weight 08/17/2016  . Depo-Provera contraceptive status 03/17/2016  . Eczema 11/01/2014  . Primary dysmenorrhea 09/01/2011    Farley Ly PT, MPT 03/19/2020, 1:46 PM  [facility] Physical Therapy [address] [place], [place], [address] Phone: [phone]   Fax:  [phone]  Name: ANIE JUNIEL MRN: [id number] Date of Birth: Aug 28, 1990

## 2020-04-07 ENCOUNTER — Ambulatory Visit: Payer: No Typology Code available for payment source

## 2020-04-16 ENCOUNTER — Ambulatory Visit: Payer: No Typology Code available for payment source | Attending: Internal Medicine

## 2020-04-16 ENCOUNTER — Encounter: Payer: No Typology Code available for payment source | Admitting: Rehabilitative and Restorative Service Providers"

## 2020-04-16 ENCOUNTER — Other Ambulatory Visit (HOSPITAL_COMMUNITY): Payer: Self-pay | Admitting: Internal Medicine

## 2020-04-16 DIAGNOSIS — Z23 Encounter for immunization: Secondary | ICD-10-CM

## 2020-04-16 MED FILL — DULoxetine HCL 30 MG CPEP: 30 | 30 days supply | Qty: 60 | Fill #1

## 2020-04-16 NOTE — Progress Notes (Signed)
   Covid-19 Vaccination Clinic  Name:  WILLEAN SCHURMAN    MRN: [id number] DOB: June 29, 1990  04/16/2020  Ms. Pursifull was observed post Covid-19 immunization for 15 minutes without incident. She was provided with Vaccine Information Sheet and instruction to access the V-Safe system.   Ms. Burbridge was instructed to call 911 with any severe reactions post vaccine: Marland Kitchen Difficulty breathing  . Swelling of face and throat  . A fast heartbeat  . A bad rash all over body  . Dizziness and weakness   Immunizations Administered    Name Date Dose VIS Date Route   Pfizer COVID-19 Vaccine 04/16/2020 12:24 PM 0.3 mL 02/20/2020 Intramuscular   Manufacturer: [employer], [employer]   Lot: [id number]   NDC: [address]-3

## 2020-05-01 MED FILL — DULoxetine HCL 30 MG CPEP: 30 | 30 days supply | Qty: 60 | Fill #1

## 2020-05-16 ENCOUNTER — Other Ambulatory Visit (HOSPITAL_COMMUNITY): Payer: Self-pay | Admitting: Nurse Practitioner

## 2020-05-16 MED FILL — FLUCONAZOLE 150 MG TABS: 150 | 1 days supply | Qty: 1 | Fill #0

## 2020-05-20 MED FILL — FLUCONAZOLE 150 MG TABS: 150 | 1 days supply | Qty: 1 | Fill #1

## 2020-06-05 MED FILL — DULoxetine HCL 30 MG CPEP: 30 | 30 days supply | Qty: 60 | Fill #2

## 2020-06-16 ENCOUNTER — Encounter: Payer: Self-pay | Admitting: Family Medicine

## 2020-06-17 MED FILL — hydrOXYzine HCL 50 MG TABS: 50 | 60 days supply | Qty: 60 | Fill #1

## 2020-07-06 ENCOUNTER — Encounter: Payer: Self-pay | Admitting: Family Medicine

## 2020-07-09 ENCOUNTER — Telehealth: Payer: No Typology Code available for payment source | Admitting: Nurse Practitioner

## 2020-07-09 ENCOUNTER — Other Ambulatory Visit (HOSPITAL_COMMUNITY): Payer: Self-pay | Admitting: Family

## 2020-07-09 DIAGNOSIS — B373 Candidiasis of vulva and vagina: Secondary | ICD-10-CM | POA: Diagnosis not present

## 2020-07-09 DIAGNOSIS — B3731 Acute candidiasis of vulva and vagina: Secondary | ICD-10-CM

## 2020-07-09 MED ORDER — FLUCONAZOLE 150 MG PO TABS: 150.0000 mg | ORAL_TABLET | ORAL | 0 refills | Status: DC | PRN

## 2020-07-09 MED ORDER — CEPHALEXIN 500 MG PO CAPS: 500.0000 mg | ORAL_CAPSULE | Freq: Two times a day (BID) | ORAL | 0 refills | Status: DC

## 2020-07-09 MED FILL — FLUCONAZOLE 150 MG TABS: 150 | 9 days supply | Qty: 3 | Fill #0

## 2020-07-09 NOTE — Progress Notes (Signed)

## 2020-07-09 NOTE — Progress Notes (Signed)

## 2020-07-09 NOTE — Addendum Note (Signed)
Addended by: Jannifer Rodney A on: 07/09/2020 05:23 PM   Modules accepted: Orders

## 2020-07-15 ENCOUNTER — Telehealth: Payer: No Typology Code available for payment source | Admitting: Physician Assistant

## 2020-07-15 DIAGNOSIS — N760 Acute vaginitis: Secondary | ICD-10-CM

## 2020-07-15 NOTE — Progress Notes (Signed)
Based on what you shared with me, I feel your condition warrants further evaluation and I recommend that you be seen for a face to face office visit. I see you were seen on 07/09/2020 with vaginal/urinary complaints and were given antibiotics and several doses of yeast medication. Giving there are ongoing symptoms you need to be seen in person so that a urine specimen can be obtained (can check for vaginal infections through this) or a vaginal swab can be obtained to make sure we know exactly what organism is causing your issue and so you get the most appropriate treatment so that this will not be an ongoing issue. Please reach out to your PCP or GYN first. If they are unable to see you in a timely fashion, please consider one of the urgent care centers listed below.    NOTE: If you entered your credit card information for this eVisit, you will not be charged. You may see a "hold" on your card for the $35 but that hold will drop off and you will not have a charge processed.   If you are having a true medical emergency please call 911.      For an urgent face to face visit, [place] has five urgent care centers for your convenience:     [facility] at [facility] Directions [phone] [address] [place], [place] [address] . 10 am - 6pm Monday - Friday    [facility] Urgent Care Center [facility]) Get Driving Directions [phone] [address] [place], [place] [address] . 10 am to 8 pm Monday-Friday . 12 pm to 8 pm [facility]gent Care at [facility] Get Driving Directions [phone] [address], [address] [place], [place] [address] . 8 am to 8 pm Monday-Friday . 9 am to 6 pm Saturday . 11 am to 6 pm Sunday     [facility] Health Urgent Care at [facility] Get Driving Directions  [phone] [address] [place], [place] [address] . 8 am to 8 pm Monday-Friday . 8 am to 4 pm  [facility] Urgent Care at [facility] Directions [phone] [address] [place], [place] [address] . 12 pm to 6 pm Monday-Friday      Your e-visit answers were reviewed by a board certified advanced clinical practitioner to complete your personal care plan.  Thank you for using e-Visits.

## 2020-07-28 MED FILL — DULoxetine HCL 30 MG CPEP: 30 | 30 days supply | Qty: 60 | Fill #3

## 2020-08-28 ENCOUNTER — Other Ambulatory Visit: Payer: Self-pay | Admitting: Family Medicine

## 2020-08-28 ENCOUNTER — Other Ambulatory Visit (HOSPITAL_COMMUNITY): Payer: Self-pay

## 2020-08-28 MED ORDER — DULOXETINE HCL 30 MG PO CPEP
ORAL_CAPSULE | ORAL | 3 refills | Status: DC
Start: 2020-08-28 — End: 2020-12-03
  Filled 2020-08-28: qty 60, 30d supply, fill #0
  Filled 2020-10-03: qty 60, 30d supply, fill #1
  Filled 2020-11-05: qty 60, 30d supply, fill #2

## 2020-08-28 MED ORDER — HYDROXYZINE HCL 50 MG PO TABS
ORAL_TABLET | Freq: Every evening | ORAL | 1 refills | Status: DC | PRN
Start: 2020-08-28 — End: 2020-12-03
  Filled 2020-08-28: qty 60, 30d supply, fill #0
  Filled 2020-11-13: qty 60, 30d supply, fill #1

## 2020-09-03 ENCOUNTER — Other Ambulatory Visit: Payer: Self-pay | Admitting: Family Medicine

## 2020-09-03 ENCOUNTER — Other Ambulatory Visit (HOSPITAL_COMMUNITY): Payer: Self-pay

## 2020-09-03 ENCOUNTER — Encounter: Payer: Self-pay | Admitting: Family Medicine

## 2020-09-03 MED ORDER — SM LORATA-DINE D 10-240 MG PO TB24
1.0000 | ORAL_TABLET | Freq: Every day | ORAL | 1 refills | Status: DC
Start: 2020-09-03 — End: 2020-12-03
  Filled 2020-09-03: qty 30, 30d supply, fill #0

## 2020-09-03 NOTE — Progress Notes (Signed)
Claritin D sent to patient's pharmacy per patient request via MyChart message.   Ronnald Ramp, MD [facility], PGY-2 [phone]

## 2020-09-04 ENCOUNTER — Other Ambulatory Visit (HOSPITAL_COMMUNITY): Payer: Self-pay

## 2020-09-05 ENCOUNTER — Other Ambulatory Visit (HOSPITAL_COMMUNITY): Payer: Self-pay

## 2020-09-09 ENCOUNTER — Other Ambulatory Visit (HOSPITAL_COMMUNITY): Payer: Self-pay

## 2020-09-13 ENCOUNTER — Encounter: Payer: Self-pay | Admitting: Family Medicine

## 2020-09-20 ENCOUNTER — Encounter: Payer: Self-pay | Admitting: Family Medicine

## 2020-10-02 ENCOUNTER — Other Ambulatory Visit: Payer: Self-pay

## 2020-10-02 ENCOUNTER — Other Ambulatory Visit (HOSPITAL_COMMUNITY): Payer: Self-pay

## 2020-10-02 ENCOUNTER — Ambulatory Visit (INDEPENDENT_AMBULATORY_CARE_PROVIDER_SITE_OTHER): Payer: No Typology Code available for payment source | Admitting: Family Medicine

## 2020-10-02 VITALS — BP 124/82 | HR 101 | Temp 98.2°F | Wt 154.0 lb

## 2020-10-02 DIAGNOSIS — Z309 Encounter for contraceptive management, unspecified: Secondary | ICD-10-CM

## 2020-10-02 LAB — POCT URINE PREGNANCY: Preg Test, Ur: NEGATIVE

## 2020-10-02 MED ORDER — NORGESTIMATE-ETH ESTRADIOL 0.25-35 MG-MCG PO TABS
1.0000 | ORAL_TABLET | Freq: Every day | ORAL | 11 refills | Status: DC
  Filled 2020-10-02: qty 28, 28d supply, fill #0

## 2020-10-02 MED ORDER — MEDROXYPROGESTERONE ACETATE 150 MG/ML IM SUSP: 150.0000 mg | Freq: Once | INTRAMUSCULAR | Status: DC

## 2020-10-02 NOTE — Progress Notes (Signed)
   Subjective:   Patient ID: Amy Barrett    DOB: 03-31-1991, 30 y.o. female   MRN: [id number]  Amy Barrett is a 30 y.o. female with a history of eczema, scoliosis, primary dysmenorrhea, chronic bilateral low back pain, ganglion cyst, loss of weight, insomnia here for contraceptive management.  Contraceptive Counseling: Patient presenting today to discuss birth control options. She is not currently on any birth control . She is sexually active with one female partner.She denies any dyspareunia, dysuria, hematuria, vaginal discharge. LMP 09/04/20 She is a nonsmoker. She denies history of migraines with aura or clotting disorders/history of blood clots. She is interested in OCPs.  Review of Systems:  Per HPI.   Objective:   BP 124/82   Pulse (!) 101   Temp 98.2 F (36.8 C) (Oral)   Wt 154 lb (69.9 kg)   LMP 09/04/2020 (Approximate)   SpO2 95%   BMI 24.12 kg/m  Vitals and nursing note reviewed.  General: pleasant young female, sitting comfortably in exam chair, well nourished, well developed, in no acute distress with non-toxic appearance Resp: breathing comfortably on room air, speaking in full sentences MSK: gait normal Neuro: Alert and oriented, speech normal  Assessment & Plan:   Encounter for contraceptive management Urine pregnancy trest negative. Discussed different contraceptive options. No contraindications to estrogen containing options. Rx for Sprintec given  Follow up as needed.  Orders Placed This Encounter  Procedures  . POCT urine pregnancy   Meds ordered this encounter  Medications  . DISCONTD: medroxyPROGESTERone (DEPO-PROVERA) injection 150 mg  . norgestimate-ethinyl estradiol (SPRINTEC 28) 0.25-35 MG-MCG tablet    Sig: Take 1 tablet by mouth daily.    Dispense:  28 tablet    Refill:  11      Orpah Cobb, DO PGY-3, [facility] 10/02/2020 5:47 PM

## 2020-10-02 NOTE — Assessment & Plan Note (Addendum)
Urine pregnancy trest negative. Discussed different contraceptive options. No contraindications to estrogen containing options. Rx for Sprintec given  Follow up as needed.

## 2020-10-03 ENCOUNTER — Other Ambulatory Visit (HOSPITAL_COMMUNITY): Payer: Self-pay

## 2020-10-06 ENCOUNTER — Encounter: Payer: Self-pay | Admitting: Family Medicine

## 2020-10-20 ENCOUNTER — Other Ambulatory Visit: Payer: Self-pay

## 2020-10-20 ENCOUNTER — Encounter (HOSPITAL_BASED_OUTPATIENT_CLINIC_OR_DEPARTMENT_OTHER): Payer: Self-pay | Admitting: Emergency Medicine

## 2020-10-20 ENCOUNTER — Other Ambulatory Visit (HOSPITAL_COMMUNITY): Payer: Self-pay

## 2020-10-20 ENCOUNTER — Emergency Department (HOSPITAL_BASED_OUTPATIENT_CLINIC_OR_DEPARTMENT_OTHER)
Admission: EM | Admit: 2020-10-20 | Discharge: 2020-10-20 | Disposition: A | Discharge status: Home / Self Care | Payer: No Typology Code available for payment source | Attending: Emergency Medicine | Admitting: Emergency Medicine

## 2020-10-20 ENCOUNTER — Emergency Department (HOSPITAL_BASED_OUTPATIENT_CLINIC_OR_DEPARTMENT_OTHER): Payer: No Typology Code available for payment source | Admitting: Radiology

## 2020-10-20 ENCOUNTER — Emergency Department (HOSPITAL_BASED_OUTPATIENT_CLINIC_OR_DEPARTMENT_OTHER): Payer: No Typology Code available for payment source

## 2020-10-20 DIAGNOSIS — Y92410 Unspecified street and highway as the place of occurrence of the external cause: Secondary | ICD-10-CM | POA: Diagnosis not present

## 2020-10-20 DIAGNOSIS — F17200 Nicotine dependence, unspecified, uncomplicated: Secondary | ICD-10-CM | POA: Diagnosis not present

## 2020-10-20 DIAGNOSIS — M546 Pain in thoracic spine: Secondary | ICD-10-CM | POA: Diagnosis not present

## 2020-10-20 DIAGNOSIS — S161XXA Strain of muscle, fascia and tendon at neck level, initial encounter: Secondary | ICD-10-CM | POA: Insufficient documentation

## 2020-10-20 DIAGNOSIS — R0789 Other chest pain: Secondary | ICD-10-CM | POA: Diagnosis not present

## 2020-10-20 DIAGNOSIS — S199XXA Unspecified injury of neck, initial encounter: Secondary | ICD-10-CM | POA: Diagnosis present

## 2020-10-20 MED ORDER — IBUPROFEN 800 MG PO TABS
800.0000 mg | ORAL_TABLET | Freq: Three times a day (TID) | ORAL | 0 refills | Status: DC | PRN
  Filled 2020-10-20: qty 21, 7d supply, fill #0

## 2020-10-20 MED ORDER — DICLOFENAC SODIUM 1 % EX GEL
2.0000 g | Freq: Four times a day (QID) | CUTANEOUS | 0 refills | Status: DC
  Filled 2020-10-20: qty 100, 12d supply, fill #0

## 2020-10-20 MED ORDER — METHOCARBAMOL 500 MG PO TABS
500.0000 mg | ORAL_TABLET | Freq: Three times a day (TID) | ORAL | 0 refills | Status: DC | PRN
  Filled 2020-10-20: qty 20, 7d supply, fill #0
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
## 2020-10-20 NOTE — Discharge Instructions (Addendum)
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
## 2020-10-23 ENCOUNTER — Other Ambulatory Visit: Payer: Self-pay

## 2020-10-23 ENCOUNTER — Other Ambulatory Visit (HOSPITAL_COMMUNITY): Payer: Self-pay

## 2020-10-23 ENCOUNTER — Ambulatory Visit (INDEPENDENT_AMBULATORY_CARE_PROVIDER_SITE_OTHER): Payer: No Typology Code available for payment source | Admitting: Family Medicine

## 2020-10-23 VITALS — BP 100/68 | HR 78 | Ht 66.0 in | Wt 159.0 lb

## 2020-10-23 DIAGNOSIS — Z23 Encounter for immunization: Secondary | ICD-10-CM

## 2020-10-23 DIAGNOSIS — K219 Gastro-esophageal reflux disease without esophagitis: Secondary | ICD-10-CM

## 2020-10-23 DIAGNOSIS — Z0000 Encounter for general adult medical examination without abnormal findings: Secondary | ICD-10-CM

## 2020-10-23 DIAGNOSIS — S134XXA Sprain of ligaments of cervical spine, initial encounter: Secondary | ICD-10-CM

## 2020-10-23 DIAGNOSIS — K30 Functional dyspepsia: Secondary | ICD-10-CM

## 2020-10-23 MED ORDER — TETANUS-DIPHTH-ACELL PERTUSSIS 5-2.5-18.5 LF-MCG/0.5 IM SUSY
PREFILLED_SYRINGE | INTRAMUSCULAR | 0 refills | Status: DC
  Filled 2020-10-23: qty 0.5, 1d supply, fill #0

## 2020-10-23 MED ORDER — FAMOTIDINE 20 MG PO TABS
20.0000 mg | ORAL_TABLET | Freq: Every day | ORAL | 0 refills | Status: DC
  Filled 2020-10-23: qty 30, 30d supply, fill #0

## 2020-10-23 MED ORDER — TETANUS-DIPHTH-ACELL PERTUSSIS 5-2.5-18.5 LF-MCG/0.5 IM SUSY: 0.5000 mL | PREFILLED_SYRINGE | Freq: Once | INTRAMUSCULAR | 0 refills | Status: AC

## 2020-10-23 NOTE — Assessment & Plan Note (Signed)
Patient with several days of acid indigestion. Counseled on avoiding trigger foods, using pillow to elevate head. Will treat with famotidine 20 mg qd for 2 weeks. If no improvement, return for evaluation.
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
## 2020-10-23 NOTE — Assessment & Plan Note (Signed)
Reminded patient to schedule physical exam when feeling better to get PAP this year. TDAP RX given.

## 2020-10-30 ENCOUNTER — Other Ambulatory Visit: Payer: Self-pay | Admitting: Family Medicine

## 2020-10-30 ENCOUNTER — Encounter: Payer: Self-pay | Admitting: Family Medicine

## 2020-11-05 ENCOUNTER — Encounter: Payer: Self-pay | Admitting: Family Medicine

## 2020-11-05 ENCOUNTER — Ambulatory Visit (INDEPENDENT_AMBULATORY_CARE_PROVIDER_SITE_OTHER): Payer: No Typology Code available for payment source | Admitting: Family Medicine

## 2020-11-05 ENCOUNTER — Other Ambulatory Visit (HOSPITAL_COMMUNITY): Payer: Self-pay

## 2020-11-05 ENCOUNTER — Other Ambulatory Visit: Payer: Self-pay

## 2020-11-05 VITALS — BP 109/62 | HR 64 | Ht 66.0 in | Wt 159.0 lb

## 2020-11-05 DIAGNOSIS — S134XXA Sprain of ligaments of cervical spine, initial encounter: Secondary | ICD-10-CM
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
## 2020-11-05 NOTE — Patient Instructions (Signed)
It was a pleasure to see you today!  For your neck pain: continue using the therapies at home for pain and stiffness. You can use heat to help relieve stiffness, ice helps with pain. You can use tylenol or ibuprofen for pain. Also you can use creams like voltaren gel, icy hot, bio freeze etc to help relieve pain (just don't use more than one at a time!).   I have placed a referral for physical thearpy. You should receive a phone call in 1-2 weeks to schedule this appointment. If for any reason you do not receive a phone call or need more help scheduling this appointment, please call our office at [phone].  Follow up in 4-6 weeks, if you have worsening symptoms, return sooner for evaluation. I hope you feel better!   Be Well,  Dr. Leary Roca
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
## 2020-11-11 ENCOUNTER — Ambulatory Visit: Payer: No Typology Code available for payment source | Attending: Family Medicine

## 2020-11-11 ENCOUNTER — Other Ambulatory Visit: Payer: Self-pay

## 2020-11-11 DIAGNOSIS — M6281 Muscle weakness (generalized): Secondary | ICD-10-CM | POA: Diagnosis present

## 2020-11-11 DIAGNOSIS — R29898 Other symptoms and signs involving the musculoskeletal system: Secondary | ICD-10-CM | POA: Insufficient documentation

## 2020-11-11 DIAGNOSIS — M542 Cervicalgia: Secondary | ICD-10-CM | POA: Diagnosis not present

## 2020-11-11 DIAGNOSIS — R293 Abnormal posture: Secondary | ICD-10-CM | POA: Diagnosis present

## 2020-11-11 DIAGNOSIS — G8929 Other chronic pain: Secondary | ICD-10-CM | POA: Diagnosis present

## 2020-11-11 DIAGNOSIS — M545 Low back pain, unspecified: Secondary | ICD-10-CM | POA: Insufficient documentation

## 2020-11-11 DIAGNOSIS — S134XXA Sprain of ligaments of cervical spine, initial encounter: Secondary | ICD-10-CM | POA: Insufficient documentation
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
## 2020-11-11 NOTE — Patient Instructions (Signed)
Access Code: [email or web address] URL: [email or web address] Date: 11/11/2020 Prepared by: Sharol Roussel  Exercises Seated Scapular Retraction - 2 x daily - 7 x weekly - 1 sets - 10 reps - 5 hold Supine Cervical Retraction with Towel - 2 x daily - 7 x weekly - 1 sets - 15 reps - 5 hold Seated Levator Scapulae Stretch - 2 x daily - 7 x weekly - 1 sets - 3 reps - 20 hold Sternocleidomastoid Stretch - 2 x daily - 7 x weekly - 1 sets - 13 reps - 20 hold Seated Upper Trapezius Stretch - 2 x daily - 7 x weekly - 1 sets - 3 reps - 20 hold

## 2020-11-12 ENCOUNTER — Encounter: Payer: Self-pay | Admitting: Family Medicine

## 2020-11-13 ENCOUNTER — Other Ambulatory Visit (HOSPITAL_COMMUNITY): Payer: Self-pay

## 2020-11-13 ENCOUNTER — Other Ambulatory Visit: Payer: Self-pay | Admitting: Emergency Medicine

## 2020-11-17 ENCOUNTER — Encounter: Payer: Self-pay | Admitting: Family Medicine

## 2020-11-17 ENCOUNTER — Other Ambulatory Visit (HOSPITAL_COMMUNITY): Payer: Self-pay

## 2020-11-17 ENCOUNTER — Ambulatory Visit: Payer: No Typology Code available for payment source | Admitting: Physical Therapy

## 2020-11-17 ENCOUNTER — Other Ambulatory Visit: Payer: Self-pay

## 2020-11-17 ENCOUNTER — Encounter: Payer: Self-pay | Admitting: Physical Therapy

## 2020-11-17 ENCOUNTER — Other Ambulatory Visit: Payer: Self-pay | Admitting: Family Medicine

## 2020-11-17 DIAGNOSIS — R293 Abnormal posture: Secondary | ICD-10-CM

## 2020-11-17 DIAGNOSIS — M545 Low back pain, unspecified: Secondary | ICD-10-CM

## 2020-11-17 DIAGNOSIS — M542 Cervicalgia: Secondary | ICD-10-CM

## 2020-11-17 DIAGNOSIS — R29898 Other symptoms and signs involving the musculoskeletal system: Secondary | ICD-10-CM

## 2020-11-17 DIAGNOSIS — S134XXA Sprain of ligaments of cervical spine, initial encounter: Secondary | ICD-10-CM

## 2020-11-17 DIAGNOSIS — G8929 Other chronic pain: Secondary | ICD-10-CM

## 2020-11-17 DIAGNOSIS — M6281 Muscle weakness (generalized): Secondary | ICD-10-CM

## 2020-11-17 MED ORDER — METHOCARBAMOL 500 MG PO TABS
500.0000 mg | ORAL_TABLET | Freq: Four times a day (QID) | ORAL | 0 refills | Status: DC
  Filled 2020-11-17 – 2020-12-02 (×2): qty 40, 10d supply, fill #0
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
## 2020-11-17 NOTE — Patient Instructions (Signed)
Addition to HEP: levator stretch and doorway stretch R/L 2x30sec ea.

## 2020-11-24 ENCOUNTER — Encounter: Payer: Self-pay | Admitting: Physical Therapy

## 2020-11-24 ENCOUNTER — Other Ambulatory Visit: Payer: Self-pay

## 2020-11-24 ENCOUNTER — Ambulatory Visit: Payer: No Typology Code available for payment source | Admitting: Physical Therapy

## 2020-11-24 DIAGNOSIS — R29898 Other symptoms and signs involving the musculoskeletal system: Secondary | ICD-10-CM

## 2020-11-24 DIAGNOSIS — M542 Cervicalgia: Secondary | ICD-10-CM

## 2020-11-24 DIAGNOSIS — M6281 Muscle weakness (generalized): Secondary | ICD-10-CM

## 2020-11-24 DIAGNOSIS — R293 Abnormal posture: Secondary | ICD-10-CM

## 2020-11-24 DIAGNOSIS — S134XXA Sprain of ligaments of cervical spine, initial encounter: Secondary | ICD-10-CM

## 2020-11-24 DIAGNOSIS — G8929 Other chronic pain: Secondary | ICD-10-CM
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
## 2020-11-24 NOTE — Patient Instructions (Signed)
Access Code: [email or web address] URL: [email or web address] Date: 11/24/2020 Prepared by: Myrla Halsted  New Exercises  Supine Lower Trunk Rotation - 1 x daily - 7 x weekly - 3 sets - 10 reps - 3sec hold Child's Pose Stretch - 1 x daily - 7 x weekly - 3 sets - 10sec or 30sec hold Child's Pose with Sidebending - 1 x daily - 7 x weekly - 3 sets - 10sec or 30 sec hold Cervical Retraction Prone on Elbows - 1 x daily - 7 x weekly - 3 sets - 10 reps Supine Segmental Cervical Flexion - 1 x daily - 7 x weekly - 3 sets - 10 reps

## 2020-11-25 ENCOUNTER — Other Ambulatory Visit (HOSPITAL_COMMUNITY): Payer: Self-pay

## 2020-12-01 ENCOUNTER — Encounter: Payer: Self-pay | Admitting: Family Medicine

## 2020-12-02 ENCOUNTER — Other Ambulatory Visit (HOSPITAL_COMMUNITY): Payer: Self-pay

## 2020-12-03 ENCOUNTER — Other Ambulatory Visit (HOSPITAL_COMMUNITY): Payer: Self-pay

## 2020-12-03 ENCOUNTER — Other Ambulatory Visit: Payer: Self-pay

## 2020-12-03 ENCOUNTER — Ambulatory Visit (INDEPENDENT_AMBULATORY_CARE_PROVIDER_SITE_OTHER): Payer: No Typology Code available for payment source | Admitting: Family Medicine

## 2020-12-03 ENCOUNTER — Encounter: Payer: Self-pay | Admitting: Family Medicine

## 2020-12-03 DIAGNOSIS — S134XXA Sprain of ligaments of cervical spine, initial encounter: Secondary | ICD-10-CM | POA: Diagnosis not present

## 2020-12-03 DIAGNOSIS — R61 Generalized hyperhidrosis: Secondary | ICD-10-CM | POA: Diagnosis not present

## 2020-12-03 DIAGNOSIS — G4709 Other insomnia: Secondary | ICD-10-CM

## 2020-12-03 MED ORDER — CYCLOBENZAPRINE HCL 10 MG PO TABS
10.0000 mg | ORAL_TABLET | Freq: Three times a day (TID) | ORAL | 1 refills | Status: DC | PRN
  Filled 2020-12-03 – 2020-12-04 (×2): qty 90, 30d supply, fill #0
  Filled 2021-04-17 – 2021-06-05 (×3): qty 90, 30d supply, fill #1

## 2020-12-03 NOTE — Patient Instructions (Signed)
I will call with blood test results.  I doubt the night sweats will turn out to be anything.  Of course, call if you develop new symptoms such as cough or weight loss. For now, stop the robaxin and try the flexeril.  Most folks find it quite sedating. Maybe invest in good black out curtains.  Maybe read the sleep hygiene stuff again.   Other than stopping the robaxin, you can keep using all the same medications.  I hope something I said helps.  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

## 2020-12-04 ENCOUNTER — Encounter: Payer: Self-pay | Admitting: Family Medicine

## 2020-12-04 ENCOUNTER — Ambulatory Visit: Payer: No Typology Code available for payment source | Attending: Family Medicine

## 2020-12-04 ENCOUNTER — Other Ambulatory Visit (HOSPITAL_COMMUNITY): Payer: Self-pay

## 2020-12-04 DIAGNOSIS — R29898 Other symptoms and signs involving the musculoskeletal system: Secondary | ICD-10-CM | POA: Diagnosis present

## 2020-12-04 DIAGNOSIS — M542 Cervicalgia: Secondary | ICD-10-CM | POA: Diagnosis not present

## 2020-12-04 DIAGNOSIS — R293 Abnormal posture: Secondary | ICD-10-CM | POA: Diagnosis present

## 2020-12-04 DIAGNOSIS — S134XXA Sprain of ligaments of cervical spine, initial encounter: Secondary | ICD-10-CM | POA: Insufficient documentation

## 2020-12-04 DIAGNOSIS — M6281 Muscle weakness (generalized): Secondary | ICD-10-CM | POA: Diagnosis present

## 2020-12-04 LAB — CBC WITH DIFFERENTIAL/PLATELET
Basophils Absolute: 0 10*3/uL (ref 0.0–0.2)
Basos: 1 %
EOS (ABSOLUTE): 0.1 10*3/uL (ref 0.0–0.4)
Eos: 2 %
Hematocrit: 33.6 % — ABNORMAL LOW (ref 34.0–46.6)
Hemoglobin: 11 g/dL — ABNORMAL LOW (ref 11.1–15.9)
Immature Grans (Abs): 0 10*3/uL (ref 0.0–0.1)
Immature Granulocytes: 0 %
Lymphocytes Absolute: 1.9 10*3/uL (ref 0.7–3.1)
Lymphs: 30 %
MCH: 28.7 pg (ref 26.6–33.0)
MCHC: 32.7 g/dL (ref 31.5–35.7)
MCV: 88 fL (ref 79–97)
Monocytes Absolute: 0.4 10*3/uL (ref 0.1–0.9)
Monocytes: 7 %
Neutrophils Absolute: 3.7 10*3/uL (ref 1.4–7.0)
Neutrophils: 60 %
Platelets: 216 10*3/uL (ref 150–450)
RBC: 3.83 x10E6/uL (ref 3.77–5.28)
RDW: 12.4 % (ref 11.7–15.4)
WBC: 6.2 10*3/uL (ref 3.4–10.8)

## 2020-12-04 LAB — CMP14+EGFR
ALT: 11 IU/L (ref 0–32)
AST: 20 IU/L (ref 0–40)
Albumin/Globulin Ratio: 2.1 (ref 1.2–2.2)
Albumin: 4.6 g/dL (ref 3.8–4.8)
Alkaline Phosphatase: 69 IU/L (ref 44–121)
BUN/Creatinine Ratio: 16 (ref 9–23)
BUN: 12 mg/dL (ref 6–20)
Bilirubin Total: 0.3 mg/dL (ref 0.0–1.2)
CO2: 22 mmol/L (ref 20–29)
Calcium: 8.6 mg/dL — ABNORMAL LOW (ref 8.7–10.2)
Chloride: 106 mmol/L (ref 96–106)
Creatinine, Ser: 0.76 mg/dL (ref 0.57–1.00)
Globulin, Total: 2.2 g/dL (ref 1.5–4.5)
Glucose: 70 mg/dL (ref 65–99)
Potassium: 4.4 mmol/L (ref 3.5–5.2)
Sodium: 137 mmol/L (ref 134–144)
Total Protein: 6.8 g/dL (ref 6.0–8.5)
eGFR: 107 mL/min/{1.73_m2} (ref >59)

## 2020-12-04 LAB — TSH: TSH: 0.489 u[IU]/mL (ref 0.450–4.500)
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
## 2020-12-05 ENCOUNTER — Encounter: Payer: Self-pay | Admitting: Family Medicine
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
## 2020-12-05 NOTE — Assessment & Plan Note (Signed)
Continue current treatments.  Will switch from robaxin to flexeril to see if sedation will help with insomnia.  She knows she can take this only at bedtime if worsens daytime fatique.

## 2020-12-05 NOTE — Assessment & Plan Note (Signed)
Check labs.  I have a low index of suspicion for significant medical problem

## 2020-12-05 NOTE — Assessment & Plan Note (Signed)
No good or easy solutions to this rapid cycle shift work changes in circadian rhythm

## 2020-12-10 ENCOUNTER — Telehealth: Payer: Self-pay

## 2020-12-10 NOTE — Telephone Encounter (Signed)
The patient did not show to her appointment today.  I called her and left a voice mail to inform her of the missed appointment and to remind her of the next appointment on 8/17.  She was asked to call and cancel her appointments if she is unable to unable to attend them.  Sharol Roussel, PT, DPT, OCS, Crt. DN

## 2020-12-12 ENCOUNTER — Other Ambulatory Visit (HOSPITAL_COMMUNITY): Payer: Self-pay

## 2020-12-12 ENCOUNTER — Other Ambulatory Visit: Payer: Self-pay | Admitting: Family Medicine

## 2020-12-12 MED ORDER — DULOXETINE HCL 30 MG PO CPEP
ORAL_CAPSULE | ORAL | 3 refills | Status: DC
  Filled 2020-12-12: qty 60, 30d supply, fill #0
  Filled 2021-01-26: qty 60, 30d supply, fill #1
  Filled 2021-03-07: qty 60, 30d supply, fill #2
  Filled 2021-04-17 – 2021-05-04 (×2): qty 60, 30d supply, fill #3

## 2020-12-15 ENCOUNTER — Encounter: Payer: Self-pay | Admitting: Family Medicine

## 2020-12-17 ENCOUNTER — Other Ambulatory Visit: Payer: Self-pay

## 2020-12-17 ENCOUNTER — Ambulatory Visit: Payer: No Typology Code available for payment source

## 2020-12-17 DIAGNOSIS — M542 Cervicalgia: Secondary | ICD-10-CM | POA: Diagnosis not present

## 2020-12-17 DIAGNOSIS — S134XXA Sprain of ligaments of cervical spine, initial encounter: Secondary | ICD-10-CM

## 2020-12-17 DIAGNOSIS — R29898 Other symptoms and signs involving the musculoskeletal system: Secondary | ICD-10-CM
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
## 2020-12-24 ENCOUNTER — Other Ambulatory Visit: Payer: Self-pay

## 2020-12-24 ENCOUNTER — Ambulatory Visit: Payer: No Typology Code available for payment source

## 2020-12-24 DIAGNOSIS — M6281 Muscle weakness (generalized): Secondary | ICD-10-CM

## 2020-12-24 DIAGNOSIS — R293 Abnormal posture: Secondary | ICD-10-CM

## 2020-12-24 DIAGNOSIS — M542 Cervicalgia: Secondary | ICD-10-CM | POA: Diagnosis not present

## 2020-12-24 DIAGNOSIS — S134XXA Sprain of ligaments of cervical spine, initial encounter: Secondary | ICD-10-CM

## 2020-12-24 DIAGNOSIS — R29898 Other symptoms and signs involving the musculoskeletal system: Secondary | ICD-10-CM
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
## 2020-12-31 ENCOUNTER — Other Ambulatory Visit: Payer: Self-pay

## 2020-12-31 ENCOUNTER — Ambulatory Visit: Payer: No Typology Code available for payment source

## 2020-12-31 DIAGNOSIS — R29898 Other symptoms and signs involving the musculoskeletal system: Secondary | ICD-10-CM

## 2020-12-31 DIAGNOSIS — M542 Cervicalgia: Secondary | ICD-10-CM

## 2020-12-31 DIAGNOSIS — S134XXA Sprain of ligaments of cervical spine, initial encounter: Secondary | ICD-10-CM
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
## 2021-01-06 ENCOUNTER — Ambulatory Visit: Payer: No Typology Code available for payment source | Attending: Family Medicine

## 2021-01-06 ENCOUNTER — Other Ambulatory Visit: Payer: Self-pay

## 2021-01-06 DIAGNOSIS — M542 Cervicalgia: Secondary | ICD-10-CM | POA: Insufficient documentation

## 2021-01-06 DIAGNOSIS — R293 Abnormal posture: Secondary | ICD-10-CM | POA: Insufficient documentation

## 2021-01-06 DIAGNOSIS — S134XXA Sprain of ligaments of cervical spine, initial encounter: Secondary | ICD-10-CM | POA: Diagnosis present

## 2021-01-06 DIAGNOSIS — R29898 Other symptoms and signs involving the musculoskeletal system: Secondary | ICD-10-CM | POA: Diagnosis present
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
## 2021-01-26 ENCOUNTER — Other Ambulatory Visit (HOSPITAL_COMMUNITY): Payer: Self-pay

## 2021-02-04 ENCOUNTER — Other Ambulatory Visit: Payer: Self-pay

## 2021-02-04 ENCOUNTER — Other Ambulatory Visit (HOSPITAL_COMMUNITY): Payer: Self-pay

## 2021-02-05 ENCOUNTER — Other Ambulatory Visit (HOSPITAL_COMMUNITY): Payer: Self-pay

## 2021-02-05 MED ORDER — IBUPROFEN 800 MG PO TABS
ORAL_TABLET | ORAL | 1 refills | Status: DC
  Filled 2021-02-05: qty 30, 10d supply, fill #0

## 2021-02-05 MED ORDER — IBUPROFEN 800 MG PO TABS
ORAL_TABLET | ORAL | 2 refills | Status: DC
  Filled 2021-02-05: qty 30, 10d supply, fill #0

## 2021-02-06 ENCOUNTER — Other Ambulatory Visit (HOSPITAL_COMMUNITY): Payer: Self-pay

## 2021-02-07 ENCOUNTER — Other Ambulatory Visit (HOSPITAL_COMMUNITY): Payer: Self-pay

## 2021-02-23 ENCOUNTER — Telehealth: Payer: No Typology Code available for payment source | Admitting: Physician Assistant

## 2021-02-23 ENCOUNTER — Other Ambulatory Visit (HOSPITAL_COMMUNITY): Payer: Self-pay

## 2021-02-23 DIAGNOSIS — H9201 Otalgia, right ear: Secondary | ICD-10-CM | POA: Diagnosis not present

## 2021-02-24 ENCOUNTER — Other Ambulatory Visit (HOSPITAL_COMMUNITY): Payer: Self-pay

## 2021-02-24 MED ORDER — AMOXICILLIN-POT CLAVULANATE 875-125 MG PO TABS
1.0000 | ORAL_TABLET | Freq: Two times a day (BID) | ORAL | 0 refills | Status: DC
  Filled 2021-02-24: qty 20, 10d supply, fill #0

## 2021-02-24 NOTE — Progress Notes (Signed)
E Visit for Swimmer's Ear  We are sorry that you are not feeling well. Here is how we plan to help!  Based on what you are sharing with me I would be more concerned for a middle ear infection instead of swimmer's ear (outer ear infection). I am prescribing an oral antibiotic as this will cover both types of infection. The antibiotic is -- Augmentin 625mg  one tablet by mouth twice a day for 10 days.   In certain cases swimmer's ear may progress to a more serious bacterial infection of the middle or inner ear.  If you have a fever 102 and up and significantly worsening symptoms, this could indicate a more serious infection moving to the middle/inner and needs face to face evaluation in an office by a provider.  Your symptoms should improve over the next 3 days and should resolve in about 7 days.  HOME CARE:  Wash your hands frequently. Do not place the tip of the bottle on your ear or touch it with your fingers. You can take Acetominophen 650 mg every 4-6 hours as needed for pain.  If pain is severe or moderate, you can apply a heating pad (set on low) or hot water bottle (wrapped in a towel) to outer ear for 20 minutes.  This will also increase drainage. Avoid ear plugs Do not use Q-tips After showers, help the water run out by tilting your head to one side.  GET HELP RIGHT AWAY IF:  Fever is over 102.2 degrees. You develop progressive ear pain or hearing loss. Ear symptoms persist longer than 3 days after treatment.  MAKE SURE YOU:  Understand these instructions. Will watch your condition. Will get help right away if you are not doing well or get worse.  TO PREVENT SWIMMER'S EAR: Use a bathing cap or custom fitted swim molds to keep your ears dry. Towel off after swimming to dry your ears. Tilt your head or pull your earlobes to allow the water to escape your ear canal. If there is still water in your ears, consider using a hairdryer on the lowest setting.   Thank you for  choosing an e-visit.  Your e-visit answers were reviewed by a board certified advanced clinical practitioner to complete your personal care plan. Depending upon the condition, your plan could have included both over the counter or prescription medications.  Please review your pharmacy choice. Make sure the pharmacy is open so you can pick up prescription now. If there is a problem, you may contact your provider through and have the prescription routed to another pharmacy.  Your safety is important to [employer]. If you have drug allergies check your prescription carefully.   For the next 24 hours you can use MyChart to ask questions about today's visit, request a non-urgent call back, or ask for a work or school excuse. You will get an email in the next two days asking about your experience. I hope that your e-visit has been valuable and will speed your recovery.

## 2021-02-24 NOTE — Progress Notes (Signed)
I have spent 5 minutes in review of e-visit questionnaire, review and updating patient chart, medical decision making and response to patient.   Shinichi Anguiano Cody Harvel Meskill, PA-C    

## 2021-03-04 ENCOUNTER — Other Ambulatory Visit (HOSPITAL_COMMUNITY): Payer: Self-pay

## 2021-03-04 ENCOUNTER — Telehealth: Payer: No Typology Code available for payment source | Admitting: Physician Assistant

## 2021-03-04 DIAGNOSIS — T3695XA Adverse effect of unspecified systemic antibiotic, initial encounter: Secondary | ICD-10-CM

## 2021-03-04 DIAGNOSIS — B379 Candidiasis, unspecified: Secondary | ICD-10-CM

## 2021-03-04 MED ORDER — FLUCONAZOLE 150 MG PO TABS
150.0000 mg | ORAL_TABLET | Freq: Once | ORAL | 0 refills | Status: AC
  Filled 2021-03-04: qty 2, 3d supply, fill #0

## 2021-03-04 NOTE — Progress Notes (Signed)

## 2021-03-05 ENCOUNTER — Ambulatory Visit: Payer: No Typology Code available for payment source | Admitting: Family Medicine

## 2021-03-05 ENCOUNTER — Encounter: Payer: Self-pay | Admitting: Family Medicine
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
## 2021-03-09 ENCOUNTER — Other Ambulatory Visit (HOSPITAL_COMMUNITY): Payer: Self-pay

## 2021-03-17 ENCOUNTER — Encounter: Payer: Self-pay | Admitting: Family Medicine

## 2021-04-01 ENCOUNTER — Encounter: Payer: Self-pay | Admitting: Family Medicine

## 2021-04-07 ENCOUNTER — Encounter: Payer: Self-pay | Admitting: Family Medicine

## 2021-04-17 ENCOUNTER — Other Ambulatory Visit (HOSPITAL_COMMUNITY): Payer: Self-pay

## 2021-04-29 ENCOUNTER — Other Ambulatory Visit (HOSPITAL_COMMUNITY): Payer: Self-pay

## 2021-05-04 ENCOUNTER — Other Ambulatory Visit (HOSPITAL_COMMUNITY): Payer: Self-pay

## 2021-05-04 ENCOUNTER — Encounter: Payer: Self-pay | Admitting: Family Medicine

## 2021-05-05 ENCOUNTER — Other Ambulatory Visit (HOSPITAL_COMMUNITY): Payer: Self-pay

## 2021-05-05 ENCOUNTER — Other Ambulatory Visit: Payer: Self-pay | Admitting: Family Medicine

## 2021-05-05 DIAGNOSIS — M419 Scoliosis, unspecified: Secondary | ICD-10-CM

## 2021-05-05 DIAGNOSIS — S134XXA Sprain of ligaments of cervical spine, initial encounter: Secondary | ICD-10-CM

## 2021-05-05 DIAGNOSIS — G8929 Other chronic pain: Secondary | ICD-10-CM

## 2021-05-05 DIAGNOSIS — M545 Low back pain, unspecified: Secondary | ICD-10-CM

## 2021-05-05 MED ORDER — DULOXETINE HCL 60 MG PO CPEP
60.0000 mg | ORAL_CAPSULE | Freq: Every day | ORAL | 1 refills | Status: DC
  Filled 2021-05-05 – 2021-06-05 (×2): qty 90, 90d supply, fill #0
  Filled 2021-09-22: qty 90, 90d supply, fill #1

## 2021-05-05 NOTE — Progress Notes (Signed)
Covering for Dr. Neita Garnet. Increased tablet # dispensed per patient request. Sent in 90 day supply. Sent in increased dose per tab so her daily dose of 60 mg is achieved in 1 tab daily instead of 2.  Fayette Pho, MD

## 2021-05-14 ENCOUNTER — Other Ambulatory Visit (HOSPITAL_COMMUNITY): Payer: Self-pay

## 2021-06-06 ENCOUNTER — Other Ambulatory Visit (HOSPITAL_COMMUNITY): Payer: Self-pay

## 2021-06-08 ENCOUNTER — Other Ambulatory Visit (HOSPITAL_COMMUNITY): Payer: Self-pay

## 2021-07-16 ENCOUNTER — Encounter: Payer: Self-pay | Admitting: Family Medicine

## 2021-07-21 ENCOUNTER — Encounter: Payer: Self-pay | Admitting: Family Medicine

## 2021-07-21 ENCOUNTER — Other Ambulatory Visit (HOSPITAL_COMMUNITY): Payer: Self-pay

## 2021-07-21 MED ORDER — FEXOFENADINE-PSEUDOEPHED ER 180-240 MG PO TB24
1.0000 | ORAL_TABLET | Freq: Every day | ORAL | 0 refills | Status: DC
  Filled 2021-07-21 – 2021-08-11 (×2): qty 60, 60d supply, fill #0
  Filled 2021-09-14: qty 15, 15d supply, fill #0

## 2021-07-30 ENCOUNTER — Telehealth: Payer: No Typology Code available for payment source | Admitting: Physician Assistant

## 2021-07-30 ENCOUNTER — Other Ambulatory Visit (HOSPITAL_COMMUNITY): Payer: Self-pay

## 2021-07-30 DIAGNOSIS — B3731 Acute candidiasis of vulva and vagina: Secondary | ICD-10-CM

## 2021-07-30 MED ORDER — FLUCONAZOLE 150 MG PO TABS
150.0000 mg | ORAL_TABLET | Freq: Once | ORAL | 0 refills | Status: DC
Start: 2021-07-30 — End: 2021-07-30
  Filled 2021-07-30: qty 1, 1d supply, fill #0

## 2021-07-30 MED ORDER — FLUCONAZOLE 150 MG PO TABS
ORAL_TABLET | ORAL | 0 refills | Status: DC
  Filled 2021-07-30: qty 3, 3d supply, fill #0

## 2021-07-30 NOTE — Progress Notes (Signed)

## 2021-07-30 NOTE — Addendum Note (Signed)
Addended by: Waldon Merl on: 07/30/2021 04:11 PM ? ? Modules accepted: Orders ? ?

## 2021-07-30 NOTE — Progress Notes (Signed)
I have spent 5 minutes in review of e-visit questionnaire, review and updating patient chart, medical decision making and response to patient.   Christophr Calix Cody Antoria Lanza, PA-C    

## 2021-08-10 ENCOUNTER — Other Ambulatory Visit (HOSPITAL_COMMUNITY): Payer: Self-pay

## 2021-08-10 MED ORDER — IBUPROFEN 800 MG PO TABS
ORAL_TABLET | ORAL | 3 refills | Status: DC
  Filled 2021-08-10 – 2021-09-22 (×2): qty 30, 10d supply, fill #0
  Filled 2022-02-03 – 2022-04-28 (×2): qty 30, 10d supply, fill #1

## 2021-08-11 ENCOUNTER — Other Ambulatory Visit (HOSPITAL_COMMUNITY): Payer: Self-pay

## 2021-08-12 ENCOUNTER — Other Ambulatory Visit (HOSPITAL_COMMUNITY): Payer: Self-pay

## 2021-08-20 ENCOUNTER — Other Ambulatory Visit (HOSPITAL_COMMUNITY): Payer: Self-pay

## 2021-08-21 ENCOUNTER — Other Ambulatory Visit (HOSPITAL_COMMUNITY): Payer: Self-pay

## 2021-09-14 ENCOUNTER — Other Ambulatory Visit (HOSPITAL_COMMUNITY): Payer: Self-pay

## 2021-09-15 ENCOUNTER — Other Ambulatory Visit (HOSPITAL_COMMUNITY): Payer: Self-pay

## 2021-09-16 ENCOUNTER — Encounter: Payer: Self-pay | Admitting: Family Medicine

## 2021-09-16 DIAGNOSIS — Z789 Other specified health status: Secondary | ICD-10-CM

## 2021-09-21 ENCOUNTER — Encounter (HOSPITAL_BASED_OUTPATIENT_CLINIC_OR_DEPARTMENT_OTHER): Payer: Self-pay | Admitting: *Deleted

## 2021-09-21 ENCOUNTER — Emergency Department (HOSPITAL_BASED_OUTPATIENT_CLINIC_OR_DEPARTMENT_OTHER)
Admission: EM | Admit: 2021-09-21 | Discharge: 2021-09-21 | Disposition: A | Discharge status: Home / Self Care | Payer: No Typology Code available for payment source | Attending: Emergency Medicine | Admitting: Emergency Medicine

## 2021-09-21 ENCOUNTER — Emergency Department (HOSPITAL_BASED_OUTPATIENT_CLINIC_OR_DEPARTMENT_OTHER): Payer: No Typology Code available for payment source

## 2021-09-21 ENCOUNTER — Other Ambulatory Visit: Payer: Self-pay

## 2021-09-21 DIAGNOSIS — M7989 Other specified soft tissue disorders: Secondary | ICD-10-CM | POA: Insufficient documentation

## 2021-09-21 DIAGNOSIS — R000 Tachycardia, unspecified: Secondary | ICD-10-CM | POA: Diagnosis not present

## 2021-09-21 DIAGNOSIS — S8012XA Contusion of left lower leg, initial encounter: Secondary | ICD-10-CM | POA: Diagnosis not present

## 2021-09-21 DIAGNOSIS — R609 Edema, unspecified: Secondary | ICD-10-CM

## 2021-09-21 DIAGNOSIS — X501XXA Overexertion from prolonged static or awkward postures, initial encounter: Secondary | ICD-10-CM | POA: Diagnosis not present

## 2021-09-21 DIAGNOSIS — S8992XA Unspecified injury of left lower leg, initial encounter: Secondary | ICD-10-CM | POA: Diagnosis present

## 2021-09-21 DIAGNOSIS — M79604 Pain in right leg: Secondary | ICD-10-CM | POA: Diagnosis not present

## 2021-09-21 HISTORY — DX: Scoliosis, unspecified: M41.9

## 2021-09-21 LAB — CBC WITH DIFFERENTIAL/PLATELET
Abs Immature Granulocytes: 0.02 10*3/uL (ref 0.00–0.07)
Basophils Absolute: 0 10*3/uL (ref 0.0–0.1)
Basophils Relative: 1 %
Eosinophils Absolute: 0.1 10*3/uL (ref 0.0–0.5)
Eosinophils Relative: 1 %
HCT: 35 % — ABNORMAL LOW (ref 36.0–46.0)
Hemoglobin: 11.4 g/dL — ABNORMAL LOW (ref 12.0–15.0)
Immature Granulocytes: 0 %
Lymphocytes Relative: 30 %
Lymphs Abs: 2.3 10*3/uL (ref 0.7–4.0)
MCH: 29.4 pg (ref 26.0–34.0)
MCHC: 32.6 g/dL (ref 30.0–36.0)
MCV: 90.2 fL (ref 80.0–100.0)
Monocytes Absolute: 0.6 10*3/uL (ref 0.1–1.0)
Monocytes Relative: 8 %
Neutro Abs: 4.7 10*3/uL (ref 1.7–7.7)
Neutrophils Relative %: 60 %
Platelets: 222 10*3/uL (ref 150–400)
RBC: 3.88 MIL/uL (ref 3.87–5.11)
RDW: 13 % (ref 11.5–15.5)
WBC: 7.8 10*3/uL (ref 4.0–10.5)
nRBC: 0 % (ref 0.0–0.2)

## 2021-09-21 LAB — BASIC METABOLIC PANEL
Anion gap: 11 (ref 5–15)
BUN: 12 mg/dL (ref 6–20)
CO2: 25 mmol/L (ref 22–32)
Calcium: 9.2 mg/dL (ref 8.9–10.3)
Chloride: 102 mmol/L (ref 98–111)
Creatinine, Ser: 0.71 mg/dL (ref 0.44–1.00)
GFR, Estimated: >60 mL/min (ref >60)
Glucose, Bld: 95 mg/dL (ref 70–99)
Potassium: 3.4 mmol/L — ABNORMAL LOW (ref 3.5–5.1)
Sodium: 138 mmol/L (ref 135–145)

## 2021-09-21 LAB — PROTIME-INR
INR: 1 (ref 0.8–1.2)
Prothrombin Time: 12.9 seconds (ref 11.4–15.2)

## 2021-09-21 MED ORDER — POTASSIUM CHLORIDE CRYS ER 20 MEQ PO TBCR
40.0000 meq | EXTENDED_RELEASE_TABLET | Freq: Once | ORAL | Status: AC
  Administered 2021-09-21: 40 meq via ORAL
  Filled 2021-09-21: qty 2
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
## 2021-09-21 NOTE — Discharge Instructions (Addendum)
Call your primary care doctor or specialist as discussed in the next 2-3 days.   Return immediately back to the ER if:  Your symptoms worsen within the next 12-24 hours. You develop new symptoms such as new fevers, persistent vomiting, new pain, shortness of breath, or new weakness or numbness, or if you have any other concerns.  
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
## 2021-09-22 ENCOUNTER — Other Ambulatory Visit (HOSPITAL_COMMUNITY): Payer: Self-pay

## 2021-10-01 NOTE — Telephone Encounter (Signed)
Patient has a immunization form for school that is showing she needs her varicella but I didn't see it on [facility].  Patient states that she has chickenpox as a child and would like to get a titer drawn to check on immunity.  She is coming in tomorrow for her PPD and would like to get her labs done at the same time. Will forward to MD to place a future varicella titer order.  Thanks. Havah Ammon,CMA
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
## 2021-10-06 ENCOUNTER — Ambulatory Visit (INDEPENDENT_AMBULATORY_CARE_PROVIDER_SITE_OTHER): Payer: No Typology Code available for payment source | Admitting: Family Medicine

## 2021-10-06 ENCOUNTER — Encounter: Payer: Self-pay | Admitting: *Deleted

## 2021-10-06 ENCOUNTER — Encounter: Payer: Self-pay | Admitting: Family Medicine

## 2021-10-06 VITALS — BP 112/70 | HR 60 | Ht 66.0 in | Wt 157.0 lb

## 2021-10-06 DIAGNOSIS — Z789 Other specified health status: Secondary | ICD-10-CM | POA: Diagnosis not present

## 2021-10-06 DIAGNOSIS — E876 Hypokalemia: Secondary | ICD-10-CM | POA: Diagnosis not present

## 2021-10-06 DIAGNOSIS — Z111 Encounter for screening for respiratory tuberculosis: Secondary | ICD-10-CM | POA: Diagnosis not present

## 2021-10-06 DIAGNOSIS — D649 Anemia, unspecified: Secondary | ICD-10-CM | POA: Diagnosis not present

## 2021-10-06 DIAGNOSIS — Z0000 Encounter for general adult medical examination without abnormal findings: Secondary | ICD-10-CM

## 2021-10-06 LAB — POCT HEMOGLOBIN: Hemoglobin: 12 g/dL (ref 11–14.6)

## 2021-10-06 NOTE — Progress Notes (Signed)
    SUBJECTIVE:   Chief compliant/HPI: annual examination  Amy BERKE is a 31 y.o. who presents today for an annual exam.   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   Concerns: Hx of anemia- seen in ED and was told her hemoglobin was low. Would like this checked. Denies heavy menses but endorses dysmenorrhea which she sees her gynecologist for. Plans to follow up with GYN this year for pap.  Low potassium- seen in ED after trip to [place] and endorses heavy alcohol consumption and emesis during this time. Was given potassium supplement and would like to recheck this.   OBJECTIVE:   BP 112/70   Pulse 60   Ht $R'5\' 6"'CD$  (1.676 m)   Wt 157 lb (71.2 kg)   LMP 09/27/2021   BMI 25.34 kg/m   Physical Exam Vitals reviewed.  Constitutional:      General: She is not in acute distress.    Appearance: She is not ill-appearing, toxic-appearing or diaphoretic.  Eyes:     Conjunctiva/sclera: Conjunctivae normal.  Cardiovascular:     Rate and Rhythm: Normal rate and regular rhythm.     Heart sounds: Normal heart sounds.  Pulmonary:     Effort: Pulmonary effort is normal.     Breath sounds: Normal breath sounds.  Abdominal:     General: Abdomen is flat. Bowel sounds are normal.     Palpations: Abdomen is soft.  Musculoskeletal:     Cervical back: Neck supple.  Lymphadenopathy:     Cervical: No cervical adenopathy.  Neurological:     Mental Status: She is alert and oriented to person, place, and time.  Psychiatric:        Mood and Affect: Mood normal.        Behavior: Behavior normal.   ASSESSMENT/PLAN:   1. Encounter for well adult exam without abnormal findings See below.   2. Unknown varicella vaccination status - Varicella zoster antibody, IgG  3. Anemia, unspecified type Discussed starting iron supp every other day.  - Ferritin - Hemoglobin  4. Hypokalemia - Basic Metabolic Panel  5. PPD screening  test - PPD  Annual Examination  See AVS for age appropriate recommendations.   PHQ score 2, reviewed and discussed. Blood pressure reviewed and at goal.  Contraception use unknown. Needs to discussed at follow up.  Considered the following items based upon USPSTF recommendations: HIV testing:  declined Hepatitis C:  declined Hepatitis B:  declined Syphilis if at high risk:  declined GC/CT  declined Lipid panel (nonfasting or fasting) discussed based upon AHA recommendations and not ordered.  Consider repeat every 4-6 years.  Reviewed risk factors for latent tuberculosis and  PPD test placed for nursing program  Discussed family history, BRCA testing  discussed and grandmother has breast cancer will follow up with OBGYN which was previously discussed to the patient . Cervical cancer screening:  due for pap and will follow up with OBGYN for this. Immunizations- UTD   Follow up in 1 year or sooner if indicated.    Gerlene Fee, [place]

## 2021-10-06 NOTE — Patient Instructions (Signed)
It was wonderful to see you today.  Please bring ALL of your medications with you to every visit.   Today we talked about:  -Completing your physical exam -Return to have PPD test read -Follow up with OBGYN this year to have pap smear completed   Please call the clinic at [phone] if your symptoms worsen or you have any concerns. It was our pleasure to serve you.  Dr. Janus Molder   Preventive Care 45-32 Years Old, Female Preventive care refers to lifestyle choices and visits with your health care provider that can promote health and wellness. Preventive care visits are also called wellness exams. What can I expect for my preventive care visit? Counseling During your preventive care visit, your health care provider may ask about your: Medical history, including: Past medical problems. Family medical history. Pregnancy history. Current health, including: Menstrual cycle. Method of birth control. Emotional well-being. Home life and relationship well-being. Sexual activity and sexual health. Lifestyle, including: Alcohol, nicotine or tobacco, and drug use. Access to firearms. Diet, exercise, and sleep habits. Work and work Statistician. Sunscreen use. Safety issues such as seatbelt and bike helmet use. Physical exam Your health care provider may check your: Height and weight. These may be used to calculate your BMI (body mass index). BMI is a measurement that tells if you are at a healthy weight. Waist circumference. This measures the distance around your waistline. This measurement also tells if you are at a healthy weight and may help predict your risk of certain diseases, such as type 2 diabetes and high blood pressure. Heart rate and blood pressure. Body temperature. Skin for abnormal spots. What immunizations do I need?  Vaccines are usually given at various ages, according to a schedule. Your health care provider will recommend vaccines for you based on your age,  medical history, and lifestyle or other factors, such as travel or where you work. What tests do I need? Screening Your health care provider may recommend screening tests for certain conditions. This may include: Pelvic exam and Pap test. Lipid and cholesterol levels. Diabetes screening. This is done by checking your blood sugar (glucose) after you have not eaten for a while (fasting). Hepatitis B test. Hepatitis C test. HIV (human immunodeficiency virus) test. STI (sexually transmitted infection) testing, if you are at risk. BRCA-related cancer screening. This may be done if you have a family history of breast, ovarian, tubal, or peritoneal cancers. Talk with your health care provider about your test results, treatment options, and if necessary, the need for more tests. Follow these instructions at home: Eating and drinking  Eat a healthy diet that includes fresh fruits and vegetables, whole grains, lean protein, and low-fat dairy products. Take vitamin and mineral supplements as recommended by your health care provider. Do not drink alcohol if: Your health care provider tells you not to drink. You are pregnant, may be pregnant, or are planning to become pregnant. If you drink alcohol: Limit how much you have to 0-1 drink a day. Know how much alcohol is in your drink. In the [place] one drink equals one 12 oz bottle of beer (355 mL), one 5 oz glass of wine (148 mL), or one 1 oz glass of hard liquor (44 mL). Lifestyle Brush your teeth every morning and night with fluoride toothpaste. Floss one time each day. Exercise for at least 30 minutes 5 or more days each week. Do not use any products that contain nicotine or tobacco. These products include cigarettes, chewing tobacco, and  vaping devices, such as e-cigarettes. If you need help quitting, ask your health care provider. Do not use drugs. If you are sexually active, practice safe sex. Use a condom or other form of protection to prevent  STIs. If you do not wish to become pregnant, use a form of birth control. If you plan to become pregnant, see your health care provider for a prepregnancy visit. Find healthy ways to manage stress, such as: Meditation, yoga, or listening to music. Journaling. Talking to a trusted person. Spending time with friends and family. Minimize exposure to UV radiation to reduce your risk of skin cancer. Safety Always wear your seat belt while driving or riding in a vehicle. Do not drive: If you have been drinking alcohol. Do not ride with someone who has been drinking. If you have been using any mind-altering substances or drugs. While texting. When you are tired or distracted. Wear a helmet and other protective equipment during sports activities. If you have firearms in your house, make sure you follow all gun safety procedures. Seek help if you have been physically or sexually abused. What's next? Go to your health care provider once a year for an annual wellness visit. Ask your health care provider how often you should have your eyes and teeth checked. Stay up to date on all vaccines. This information is not intended to replace advice given to you by your health care provider. Make sure you discuss any questions you have with your health care provider. Document Revised: 10/15/2020 Document Reviewed: 10/15/2020 Elsevier Patient Education  [place].

## 2021-10-06 NOTE — Progress Notes (Signed)
PPD placed in left forearm @ 10:35 am.  Patient will return 10/08/21 to have PPD read. Jone Baseman, CMA

## 2021-10-07 LAB — BASIC METABOLIC PANEL
BUN/Creatinine Ratio: 12 (ref 9–23)
BUN: 10 mg/dL (ref 6–20)
CO2: 22 mmol/L (ref 20–29)
Calcium: 9.6 mg/dL (ref 8.7–10.2)
Chloride: 101 mmol/L (ref 96–106)
Creatinine, Ser: 0.82 mg/dL (ref 0.57–1.00)
Glucose: 79 mg/dL (ref 70–99)
Potassium: 4.5 mmol/L (ref 3.5–5.2)
Sodium: 139 mmol/L (ref 134–144)
eGFR: 97 mL/min/{1.73_m2} (ref >59)

## 2021-10-07 LAB — VARICELLA ZOSTER ANTIBODY, IGG: Varicella zoster IgG: 998 index (ref >165)

## 2021-10-07 LAB — FERRITIN: Ferritin: 22 ng/mL (ref 15–150)

## 2021-10-08 ENCOUNTER — Ambulatory Visit: Payer: No Typology Code available for payment source

## 2021-10-08 DIAGNOSIS — Z111 Encounter for screening for respiratory tuberculosis: Secondary | ICD-10-CM

## 2021-10-08 LAB — TB SKIN TEST
Induration: 0 mm
TB Skin Test: NEGATIVE

## 2021-10-08 NOTE — Progress Notes (Signed)
PPD Reading Note PPD read and results entered in [employer]. Result: 0 mm induration. Interpretation: Negative Allergic reaction: Yes  

## 2021-10-28 ENCOUNTER — Encounter: Payer: No Typology Code available for payment source | Admitting: Family Medicine

## 2021-11-01 NOTE — Progress Notes (Signed)
    SUBJECTIVE:   CHIEF COMPLAINT / HPI: request for quant gold for school screening  Patient reports that she is doing well.  She had a normal PPD placed in June but states that her school is requiring that she have a quant gold test.  She denies any shortness of breath, hemoptysis or chest pain.  Patient has physical forms to be completed.  She is here for vision, hearing screenings.  She requests refills for Flexeril for muscle spasm.  She also requests refills for hydroxyzine for as needed use for anxiety/sleep.   She denies any other issues.  PERTINENT  PMH / PSH: Scoliosis OBJECTIVE:   BP 126/79   Pulse 70   Temp 98.4 F (36.9 C)   Ht 5\' 6"  (1.676 m)   Wt 159 lb 3.2 oz (72.2 kg)   LMP 10/24/2021 (Exact Date)   SpO2 100%   BMI 25.70 kg/m   Physical Exam Constitutional:      Appearance: She is normal weight.  HENT:     Right Ear: External ear normal.     Left Ear: External ear normal.     Nose: Nose normal.  Cardiovascular:     Rate and Rhythm: Normal rate and regular rhythm.     Pulses: Normal pulses.     Heart sounds: Normal heart sounds. No murmur heard.    No friction rub. No gallop.  Pulmonary:     Effort: Pulmonary effort is normal.     Breath sounds: Normal breath sounds. No wheezing, rhonchi or rales.  Abdominal:     General: Bowel sounds are normal. There is no distension.     Palpations: Abdomen is soft.     Tenderness: There is no abdominal tenderness.  Musculoskeletal:     Cervical back: Normal range of motion.     Right lower leg: No edema.     Left lower leg: No edema.  Skin:    General: Skin is warm.     Capillary Refill: Capillary refill takes less than 2 seconds.     Findings: No rash.  Neurological:     General: No focal deficit present.     Mental Status: She is alert and oriented to person, place, and time.      

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
## 2021-11-02 ENCOUNTER — Encounter: Payer: Self-pay | Admitting: Family Medicine

## 2021-11-02 ENCOUNTER — Other Ambulatory Visit (HOSPITAL_COMMUNITY): Payer: Self-pay

## 2021-11-02 ENCOUNTER — Ambulatory Visit (INDEPENDENT_AMBULATORY_CARE_PROVIDER_SITE_OTHER): Payer: No Typology Code available for payment source | Admitting: Family Medicine

## 2021-11-02 VITALS — BP 126/79 | HR 70 | Temp 98.4°F | Ht 66.0 in | Wt 159.2 lb

## 2021-11-02 DIAGNOSIS — Z111 Encounter for screening for respiratory tuberculosis: Secondary | ICD-10-CM | POA: Insufficient documentation

## 2021-11-02 DIAGNOSIS — Z1389 Encounter for screening for other disorder: Secondary | ICD-10-CM | POA: Diagnosis not present

## 2021-11-02 DIAGNOSIS — G4709 Other insomnia: Secondary | ICD-10-CM

## 2021-11-02 LAB — POCT URINALYSIS DIP (MANUAL ENTRY)
Bilirubin, UA: NEGATIVE
Blood, UA: NEGATIVE
Glucose, UA: NEGATIVE mg/dL
Ketones, POC UA: NEGATIVE mg/dL
Leukocytes, UA: NEGATIVE
Nitrite, UA: NEGATIVE
Protein Ur, POC: NEGATIVE mg/dL
Spec Grav, UA: 1.025 (ref 1.010–1.025)
Urobilinogen, UA: 0.2 E.U./dL
pH, UA: 6 (ref 5.0–8.0)

## 2021-11-02 MED ORDER — CYCLOBENZAPRINE HCL 10 MG PO TABS
10.0000 mg | ORAL_TABLET | Freq: Three times a day (TID) | ORAL | 0 refills | Status: DC | PRN
  Filled 2021-11-02 – 2021-12-01 (×2): qty 90, 30d supply, fill #0

## 2021-11-02 MED ORDER — HYDROXYZINE HCL 50 MG PO TABS
50.0000 mg | ORAL_TABLET | Freq: Every evening | ORAL | 1 refills | Status: DC | PRN
  Filled 2021-11-02: qty 90, 90d supply, fill #0
  Filled 2021-12-22: qty 30, 30d supply, fill #0

## 2021-11-02 NOTE — Patient Instructions (Signed)
Today we will complete the quant gold TB blood test for your upcoming school assessment.  We will also complete urine testing as recommended on your screening form.  Please allow [place] 1 week to get the results of your blood test to complete your physical form.  Someone from our office will call you when the form is completed and ready for you to pick up.   I have provided refills for your hydroxyzine and Flexeril.

## 2021-11-02 NOTE — Assessment & Plan Note (Signed)
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

## 2021-11-02 NOTE — Assessment & Plan Note (Signed)
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

## 2021-11-02 NOTE — Assessment & Plan Note (Signed)
Refills provided for hydroxyzine 50 mg at bedtime as needed

## 2021-11-05 LAB — QUANTIFERON-TB GOLD PLUS
QuantiFERON Mitogen Value: >10 IU/mL
QuantiFERON Nil Value: 0.02 IU/mL
QuantiFERON TB1 Ag Value: 0.02 IU/mL
QuantiFERON TB2 Ag Value: 0.01 IU/mL
QuantiFERON-TB Gold Plus: NEGATIVE

## 2021-11-11 ENCOUNTER — Telehealth: Payer: Self-pay

## 2021-11-11 NOTE — Telephone Encounter (Signed)
School form placed up front for pick up.   Copy made for batch scanning.   Patient aware.

## 2021-11-17 ENCOUNTER — Other Ambulatory Visit (HOSPITAL_COMMUNITY): Payer: Self-pay

## 2021-11-18 ENCOUNTER — Encounter: Payer: Self-pay | Admitting: Family Medicine

## 2021-12-01 ENCOUNTER — Other Ambulatory Visit (HOSPITAL_COMMUNITY): Payer: Self-pay

## 2021-12-23 ENCOUNTER — Other Ambulatory Visit (HOSPITAL_COMMUNITY): Payer: Self-pay

## 2021-12-24 ENCOUNTER — Encounter: Payer: Self-pay | Admitting: Family Medicine

## 2021-12-25 ENCOUNTER — Other Ambulatory Visit (HOSPITAL_COMMUNITY): Payer: Self-pay

## 2022-01-04 ENCOUNTER — Other Ambulatory Visit: Payer: Self-pay | Admitting: Family Medicine

## 2022-01-04 DIAGNOSIS — S134XXA Sprain of ligaments of cervical spine, initial encounter: Secondary | ICD-10-CM

## 2022-01-04 DIAGNOSIS — M419 Scoliosis, unspecified: Secondary | ICD-10-CM

## 2022-01-04 DIAGNOSIS — G8929 Other chronic pain: Secondary | ICD-10-CM

## 2022-01-05 ENCOUNTER — Other Ambulatory Visit (HOSPITAL_COMMUNITY): Payer: Self-pay

## 2022-01-05 MED ORDER — DULOXETINE HCL 60 MG PO CPEP
60.0000 mg | ORAL_CAPSULE | Freq: Every day | ORAL | 1 refills | Status: DC
  Filled 2022-01-05: qty 90, 90d supply, fill #0
  Filled 2022-04-17 – 2022-04-28 (×2): qty 90, 90d supply, fill #1

## 2022-02-04 ENCOUNTER — Other Ambulatory Visit (HOSPITAL_COMMUNITY): Payer: Self-pay

## 2022-02-05 ENCOUNTER — Ambulatory Visit (INDEPENDENT_AMBULATORY_CARE_PROVIDER_SITE_OTHER): Payer: No Typology Code available for payment source | Admitting: Family Medicine

## 2022-02-05 ENCOUNTER — Encounter: Payer: Self-pay | Admitting: Family Medicine

## 2022-02-05 VITALS — BP 128/78 | HR 72 | Resp 16 | Ht 66.0 in | Wt 163.0 lb

## 2022-02-05 DIAGNOSIS — R41840 Attention and concentration deficit: Secondary | ICD-10-CM

## 2022-02-05 NOTE — Progress Notes (Signed)
I,Amy Barrett,acting as a scribe for [employer], MD.,have documented all relevant documentation on the behalf of Amy Foster, MD,as directed by  Amy Foster, MD while in the presence of Amy Foster, MD.  New patient visit   Patient: Amy Barrett   DOB: 12/27/1990   31 y.o. Female  MRN: [id number] Visit Date: 02/05/2022  Today's healthcare provider: Eulis Foster, MD   Chief Complaint  Patient presents with  . Establish Care   Subjective    Amy Barrett is a 31 y.o. female who presents today as a new patient to establish care.  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 She reports noticing that her grades have declined and she is maintaining a C average instead of recommended be average for this program She reports reading for assignments and not being able to answer questions related to the recently read material She states that she is having a hard time concentrating on topics in her program She reports feeling some anxiety related to the pressure of the program being competitive nature She states that she has not had lifelong problems with attention in school and that this problem started only a few months ago        Past Medical History:  Diagnosis Date  . Allergy   . BV (bacterial vaginosis)   . Dysmenorrhea since adolescence   [place] in 2011 that did not show any acute findings. Seen by Dr. Orvan Seen  . Scoliosis   . Yeast vaginitis    History reviewed. No pertinent surgical history. Family Status  Relation Name Status  . Mother  Alive  . Father  Alive  . Son  (Not Specified)  . Mat Amy Barrett  . Amy Barrett  Deceased  . Amy Barrett  Alive  . MGM  Alive  . MGF  Deceased   Family History  Problem Relation Age of Onset  . Heart disease Mother   . Hyperlipidemia Mother   . Hypertension Mother   . Emphysema Father   . Breast cancer Son   . Breast cancer Paternal  Uncle   . Cancer Paternal Uncle   . Breast cancer Maternal Grandmother 69  . Hypertension Maternal Grandmother    Social History   Socioeconomic History  . Marital status: Single    Spouse name: Not on file  . Number of children: Not on file  . Years of education: Not on file  . Highest education level: Not on file  Occupational History  . Not on file  Tobacco Use  . Smoking status: Some Days    Types: Cigars  . Smokeless tobacco: Current  . Tobacco comments:    Black and miles  Vaping Use  . Vaping Use: Never used  Substance and Sexual Activity  . Alcohol use: Yes    Alcohol/week: 0.0 standard drinks of alcohol    Comment: occasional, social  . Drug use: No  . Sexual activity: Not Currently    Partners: Male    Birth control/protection: Injection    Comment: intercourse age 41, more than 5 sexual partners  Other Topics Concern  . Not on file  Social History Narrative  . Not on file   Social Determinants of Health   Financial Resource Strain: Not on file  Food Insecurity: Not on file  Transportation Needs: Not on file  Physical Activity: Not on file  Stress: Not on file  Social Connections: Not on file  Outpatient Medications Prior to Visit  Medication Sig  . cetirizine (ZYRTEC) 10 MG tablet Take by mouth.  . cyclobenzaprine (FLEXERIL) 10 MG tablet Take 1 tablet (10 mg total) by mouth 3 (three) times daily as needed for muscle spasms.  . DULoxetine (CYMBALTA) 60 MG capsule Take 1 capsule by mouth daily.  . fluticasone (FLONASE) 50 MCG/ACT nasal spray Place into the nose.  . hydrOXYzine (ATARAX) 50 MG tablet Take 1 tablet (50 mg total) by mouth at bedtime as needed.  Marland Kitchen ibuprofen (ADVIL) 800 MG tablet Take 1 tablet by mouth every 8 hours with food or milk as needed   No facility-administered medications prior to visit.   No Known Allergies  Immunization History  Administered Date(s) Administered  . Influenza,inj,Quad PF,6+ Mos 02/14/2013  .  Influenza,inj,Quad PF,6-35 Mos 02/11/2017  . Influenza-Unspecified 02/13/2019, 01/28/2020, 01/28/2022  . PFIZER(Purple Top)SARS-COV-2 Vaccination 08/24/2019, 09/21/2019, 04/16/2020  . PPD Test 10/06/2021  . Tdap 10/23/2020    Health Maintenance  Topic Date Due  . COVID-19 Vaccine (4 - Pfizer series) 06/11/2020  . PAP SMEAR-Modifier  07/12/2020  . TETANUS/TDAP  10/24/2030  . INFLUENZA VACCINE  Completed  . Hepatitis C Screening  Completed  . HIV Screening  Completed  . HPV VACCINES  Aged Out    Patient Care Team: Amy Foster, MD as PCP - General (Family Medicine)  Review of Systems  All other systems reviewed and are negative.      Objective    BP 128/78 (BP Location: Left Arm, Patient Position: Sitting, Cuff Size: Normal)   Pulse 72   Resp 16   Ht 5\' 6"  (1.676 m)   Wt 163 lb (73.9 kg)   LMP 01/06/2022   SpO2 100%   BMI 26.31 kg/m    Physical Exam Vitals reviewed.  Constitutional:      General: She is not in acute distress.    Appearance: Normal appearance. She is not ill-appearing, toxic-appearing or diaphoretic.  Eyes:     Conjunctiva/sclera: Conjunctivae normal.  Cardiovascular:     Rate and Rhythm: Normal rate and regular rhythm.     Pulses: Normal pulses.     Heart sounds: Normal heart sounds. No murmur heard.    No friction rub. No gallop.  Pulmonary:     Effort: Pulmonary effort is normal. No respiratory distress.     Breath sounds: Normal breath sounds. No stridor. No wheezing, rhonchi or rales.  Neurological:     Mental Status: She is alert and oriented to person, place, and time.  Psychiatric:        Attention and Perception: Attention normal.        Mood and Affect: Mood normal. Mood is not anxious.        Speech: Speech normal.        Behavior: Behavior normal. Behavior is not agitated, aggressive or hyperactive. Behavior is cooperative.        Thought Content: Thought content does not include homicidal or suicidal ideation.  Thought content does not include homicidal plan.     Depression Screen    02/05/2022    9:28 AM 10/06/2021   10:15 AM 12/03/2020    2:07 PM 11/05/2020    9:02 AM  PHQ 2/9 Scores  PHQ - 2 Score 1 0 0 0  PHQ- 9 Score 4 2 4 2    No results found for any visits on 02/05/22.  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     I, Amy Foster, MD, have reviewed all documentation for this visit. The documentation on 02/05/22 for the exam, diagnosis, procedures, and orders are all accurate and complete.    Amy Foster, MD  [facility] [phone] (phone) [phone] (fax)  [place]

## 2022-02-05 NOTE — Assessment & Plan Note (Addendum)
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

## 2022-02-05 NOTE — Patient Instructions (Signed)
[facility]   [phone]  [email or web address]   I will submit a referral and your information for them to schedule an appointment with you.    Please follow up with me in a month

## 2022-02-07 ENCOUNTER — Encounter: Payer: Self-pay | Admitting: Family Medicine

## 2022-02-08 ENCOUNTER — Other Ambulatory Visit: Payer: Self-pay | Admitting: Family Medicine

## 2022-02-08 ENCOUNTER — Other Ambulatory Visit (HOSPITAL_COMMUNITY): Payer: Self-pay

## 2022-02-08 MED ORDER — HYDROXYZINE HCL 50 MG PO TABS
50.0000 mg | ORAL_TABLET | Freq: Three times a day (TID) | ORAL | 1 refills | Status: DC | PRN
  Filled 2022-02-08: qty 180, 60d supply, fill #0

## 2022-02-10 ENCOUNTER — Encounter: Payer: Self-pay | Admitting: Family Medicine

## 2022-02-11 ENCOUNTER — Encounter: Payer: Self-pay | Admitting: Family Medicine

## 2022-02-12 ENCOUNTER — Other Ambulatory Visit (HOSPITAL_BASED_OUTPATIENT_CLINIC_OR_DEPARTMENT_OTHER): Payer: Self-pay

## 2022-02-20 ENCOUNTER — Other Ambulatory Visit (HOSPITAL_COMMUNITY): Payer: Self-pay
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
## 2022-03-19 ENCOUNTER — Other Ambulatory Visit (HOSPITAL_COMMUNITY): Payer: Self-pay

## 2022-03-19 ENCOUNTER — Encounter: Payer: Self-pay | Admitting: Family Medicine

## 2022-03-19 ENCOUNTER — Ambulatory Visit (INDEPENDENT_AMBULATORY_CARE_PROVIDER_SITE_OTHER): Payer: No Typology Code available for payment source | Admitting: Family Medicine

## 2022-03-19 VITALS — BP 121/81 | HR 73 | Resp 16 | Ht 67.0 in | Wt 164.1 lb

## 2022-03-19 DIAGNOSIS — M419 Scoliosis, unspecified: Secondary | ICD-10-CM | POA: Diagnosis not present

## 2022-03-19 DIAGNOSIS — F419 Anxiety disorder, unspecified: Secondary | ICD-10-CM | POA: Diagnosis not present

## 2022-03-19 DIAGNOSIS — R41840 Attention and concentration deficit: Secondary | ICD-10-CM

## 2022-03-19 MED ORDER — BUSPIRONE HCL 5 MG PO TABS
5.0000 mg | ORAL_TABLET | Freq: Two times a day (BID) | ORAL | 1 refills | Status: DC | PRN
  Filled 2022-03-19: qty 60, 30d supply, fill #0
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
## 2022-03-19 NOTE — Assessment & Plan Note (Signed)
Patient reports that the symptoms are persistent, anxiety stable without panic attacks She continues Cymbalta 60 mg daily She has been taking Atarax 50 mg sometimes 3 times daily and does not notice any improvement in her feelings of nervousness or increased heart rate Patient is agreeable to starting BuSpar 5 mg twice daily as needed

## 2022-03-19 NOTE — Patient Instructions (Signed)
Lumbosacral Strain Leave the heat on for 20-30 minutes. Remove the heat if your skin turns bright red. This is especially important if you are unable to feel pain, heat, or cold. You may have a greater risk of getting burned. Activity Rest as told by your health care provider. Do not stay in bed. Staying in bed for more than 1-2 days can delay your recovery. Return to your normal activities as told by your health care provider. Ask your health care provider what activities are safe for you. Avoid activities that take a lot of energy for as long as told by your health care provider. Do exercises as told by your health care provider. This includes stretching and strengthening exercises. General instructions Sit up and stand up straight. Avoid leaning forward when you sit, or hunching over when you stand. Do not use any products that contain nicotine or tobacco, such as cigarettes, e-cigarettes, and chewing tobacco. If you need help quitting, ask your health care provider. Keep all follow-up visits as told by your health care provider. This is important. How is this prevented?  Use correct form when playing sports and lifting heavy objects. Use good posture when sitting and standing. Maintain a healthy weight. Sleep on a mattress with medium firmness to support your back. Do at least 150 minutes of moderate-intensity exercise each week, such as brisk walking or water aerobics. Try a form of exercise that takes stress off your back, such as swimming or stationary cycling. Maintain physical fitness, including: Strength. Flexibility. Contact a health care provider if: Your back pain does not improve after several weeks of treatment. Your symptoms get worse. Get help right away if: Your back pain is severe. You cannot stand or walk. You have difficulty controlling when you urinate or when you have a bowel movement. You feel nauseous or you vomit. Your feet or legs get very cold, turn pale, or  look blue. You have numbness, tingling, weakness, or problems using your arms or legs. You develop any of the following: Shortness of breath. Dizziness. Pain in your legs. Weakness in your buttocks or legs.   Thoracic Strain Rehab Ask your health care provider which exercises are safe for you. Do exercises exactly as told by your health care provider and adjust them as directed. It is normal to feel mild stretching, pulling, tightness, or discomfort as you do these exercises. Stop right away if you feel sudden pain or your pain gets worse. Do not begin these exercises until told by your health care provider. Stretching and range-of-motion exercise This exercise warms up your muscles and joints and improves the movement and flexibility of your back and shoulders. This exercise also helps to relieve pain. Chest and spine stretch  Lie down on your back on a firm surface. Roll a towel or a small blanket so it is about 4 inches (10 cm) in diameter. Put the towel lengthwise under the middle of your back so it is under your spine, but not under your shoulder blades. Put your hands behind your head and let your elbows fall to your sides. This will increase your stretch. Take a deep breath (inhale). Hold for __________ seconds. Relax after you breathe out (exhale). Repeat __________ times. Complete this exercise __________ times a day. Strengthening exercises These exercises build strength and endurance in your back and your shoulder blade muscles. Endurance is the ability to use your muscles for a long time, even after they get tired. Alternating arm and leg raises  Get on your hands and knees on a firm surface. If you are on a hard floor, you may want to use padding, such as an exercise mat, to cushion your knees. Line up your arms and legs. Your hands should be directly below your shoulders, and your knees should be directly below your hips. Lift your left leg behind you. At the same time,  raise your right arm and straighten it in front of you. Do not lift your leg higher than your hip. Do not lift your arm higher than your shoulder. Keep your abdominal and back muscles tight. Keep your hips facing the ground. Do not arch your back. Keep your balance carefully, and do not hold your breath. Hold for __________ seconds. Slowly return to the starting position and repeat with your right leg and your left arm. Repeat __________ times. Complete this exercise __________ times a day. Straight arm rows This exercise is also called shoulder extension exercise. Stand with your feet shoulder width apart. Secure an exercise band to a stable object in front of you so the band is at or above shoulder height. Hold one end of the exercise band in each hand. Straighten your elbows and lift your hands up to shoulder height. Step back, away from the secured end of the exercise band, until the band stretches. Squeeze your shoulder blades together and pull your hands down to the sides of your thighs. Stop when your hands are straight down by your sides. This is shoulder extension. Do not let your hands go behind your body. Hold for __________ seconds. Slowly return to the starting position. Repeat __________ times. Complete this exercise __________ times a day. Prone shoulder external rotation Lie on your abdomen on a firm bed so your left / right forearm hangs over the edge of the bed and your upper arm is on the bed, straight out from your body. This is the prone position. Your elbow should be bent. Your palm should be facing your feet. If instructed, hold a __________ weight in your hand. Squeeze your shoulder blade toward the middle of your back. Do not let your shoulder lift toward your ear. Keep your elbow bent in a 90-degree angle (right angle) while you slowly move your forearm up toward the ceiling. Move your forearm up to the height of the bed, toward your head. This is external  rotation. Your upper arm should not move. At the top of the movement, your palm should face the floor. Hold for __________ seconds. Slowly return to the starting position and relax your muscles. Repeat __________ times. Complete this exercise __________ times a day. Rowing scapular retraction This is an exercise in which the shoulder blades (scapulae) are pulled toward each other (retraction). Sit in a stable chair without armrests, or stand up. Secure an exercise band to a stable object in front of you so the band is at shoulder height. Hold one end of the exercise band in each hand. Your palms should face down. Bring your arms out straight in front of you. Step back, away from the secured end of the exercise band, until the band stretches. Pull the band backward. As you do this, bend your elbows and squeeze your shoulder blades together, but avoid letting the rest of your body move. Do not shrug your shoulders upward while you do this. Stop when your elbows are at your sides or slightly behind your body. Hold for __________ seconds. Slowly straighten your arms to return to the starting position.  Repeat __________ times. Complete this exercise __________ times a day. Posture and body mechanics Good posture and healthy body mechanics can help to relieve stress in your body's tissues and joints. Body mechanics refers to the movements and positions of your body while you do your daily activities. Posture is part of body mechanics. Good posture means: Your spine is in its natural S-curve position (neutral). Your shoulders are pulled back slightly. Your head is not tipped forward. Follow these guidelines to improve your posture and body mechanics in your everyday activities. Standing  When standing, keep your spine neutral and your feet about hip width apart. Keep a slight bend in your knees. Your ears, shoulders, and hips should line up with each other. When you do a task in which you lean  forward while standing in one place for a long time, place one foot up on a stable object that is 2-4 inches (5-10 cm) high, such as a footstool. This helps keep your spine neutral. Sitting  When sitting, keep your spine neutral and keep your feet flat on the floor. Use a footrest, if necessary, and keep your thighs parallel to the floor. Avoid rounding your shoulders, and avoid tilting your head forward. When working at a desk or a computer, keep your desk at a height where your hands are slightly lower than your elbows. Slide your chair under your desk so you are close enough to maintain good posture. When working at a computer, place your monitor at a height where you are looking straight ahead and you do not have to tilt your head forward or downward to look at the screen. Resting When lying down and resting, avoid positions that are most painful for you. If you have pain with activities such as sitting, bending, stooping, or squatting (flexion-basedactivities), lie in a position in which your body does not bend very much. For example, avoid curling up on your side with your arms and knees near your chest (fetal position). If you have pain with activities such as standing for a long time or reaching with your arms (extension-basedactivities), lie with your spine in a neutral position and bend your knees slightly. Try the following positions: Lie on your side with a pillow between your knees. Lie on your back with a pillow under your knees.  Lifting  When lifting objects, keep your feet at least shoulder width apart and tighten your abdominal muscles. Bend your knees and hips and keep your spine neutral. It is important to lift using the strength of your legs, not your back. Do not lock your knees straight out. Always ask for help to lift heavy or awkward objects. This information is not intended to replace advice given to you by your health care provider. Make sure you discuss any questions you  have with your health care provider. Document Revised: 08/11/2018 Document Reviewed: 05/29/2018 Elsevier Patient Education  2023 [employer].

## 2022-03-19 NOTE — Assessment & Plan Note (Signed)
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

## 2022-03-22 ENCOUNTER — Encounter: Payer: Self-pay | Admitting: Family Medicine

## 2022-04-09 ENCOUNTER — Other Ambulatory Visit (HOSPITAL_COMMUNITY): Payer: Self-pay

## 2022-04-13 ENCOUNTER — Ambulatory Visit: Payer: No Typology Code available for payment source | Admitting: Family Medicine

## 2022-04-13 NOTE — Progress Notes (Deleted)
      Established patient visit   Patient: Amy Barrett   DOB: [birth year]   31 y.o. Female  MRN: [id number] Visit Date: 04/13/2022  Today's healthcare provider: Ronnald Ramp, MD   No chief complaint on file.  Subjective    HPI  Back Pain: patient here to be shown a device to help with back pain flares.   Medications: Outpatient Medications Prior to Visit  Medication Sig   busPIRone (BUSPAR) 5 MG tablet Take 1 tablet (5 mg total) by mouth 2 (two) times daily as needed.   cyclobenzaprine (FLEXERIL) 10 MG tablet Take 1 tablet (10 mg total) by mouth 3 (three) times daily as needed for muscle spasms.   DULoxetine (CYMBALTA) 60 MG capsule Take 1 capsule by mouth daily.   fluticasone (FLONASE) 50 MCG/ACT nasal spray Place into the nose.   hydrOXYzine (ATARAX) 50 MG tablet Take 1 tablet (50 mg total) by mouth 3 (three) times daily as needed for anxiety.   ibuprofen (ADVIL) 800 MG tablet Take 1 tablet by mouth every 8 hours with food or milk as needed   No facility-administered medications prior to visit.    Review of Systems  {Labs  Heme  Chem  Endocrine  Serology  Results Review (optional):23779}   Objective    There were no vitals taken for this visit. {Show previous vital signs (optional):23777}  Physical Exam  ***  No results found for any visits on 04/13/22.  Assessment & Plan     ***  No follow-ups on file.      {provider attestation***:1}   Ronnald Ramp, MD  [facility] [phone] (phone) [phone] (fax)  [facility]

## 2022-04-15 ENCOUNTER — Encounter: Payer: Self-pay | Admitting: Family Medicine

## 2022-04-15 ENCOUNTER — Ambulatory Visit (INDEPENDENT_AMBULATORY_CARE_PROVIDER_SITE_OTHER): Payer: No Typology Code available for payment source | Admitting: Family Medicine

## 2022-04-15 VITALS — BP 111/73 | HR 58 | Temp 98.2°F | Resp 16 | Wt 166.2 lb

## 2022-04-15 DIAGNOSIS — M419 Scoliosis, unspecified: Secondary | ICD-10-CM | POA: Diagnosis not present

## 2022-04-15 NOTE — Assessment & Plan Note (Signed)
Intermittent pain flares  Last episode two days ago  Prescribed cymbalta for chronic back pain, has not started this medication as of yet Prescribed Zynex NexWave device to assist with pain management during flares  Will follow up in 3 months  She has completed PT for this problem

## 2022-04-15 NOTE — Patient Instructions (Signed)
We will prescribe for you to get the new NexGen device to help with flares of back pain related to scoliosis.
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
## 2022-04-19 ENCOUNTER — Other Ambulatory Visit (HOSPITAL_COMMUNITY): Payer: Self-pay

## 2022-04-23 ENCOUNTER — Other Ambulatory Visit: Payer: Self-pay

## 2022-07-13 ENCOUNTER — Encounter: Payer: Self-pay | Admitting: Family Medicine

## 2022-07-16 NOTE — Progress Notes (Unsigned)
    MyChart Video Visit    Virtual Visit via Video Note   This format is felt to be most appropriate for this patient at this time. Physical exam was limited by quality of the video and audio technology used for the visit.   Patient location: *** Provider location: ***  I discussed the limitations of evaluation and management by telemedicine and the availability of in person appointments. The patient expressed understanding and agreed to proceed.  Patient: Amy Barrett   DOB: 08-Dec-1990   32 y.o. Female  MRN: [id number] Visit Date: 07/19/2022  Today's healthcare provider: Eulis Foster, MD   No chief complaint on file.  Subjective    HPI  Follow up for Scoliosis  The patient was last seen for this 3 months ago. Changes made at last visit include Prescribed Zynex NexWave device to assist with pain management during flares.  She reports {excellent/good/fair/poor:19665} compliance with treatment. She feels that condition is {improved/worse/unchanged:3041574}. She {is/is not:21021397} having side effects. ***  -----------------------------------------------------------------------------------------    Medications: Outpatient Medications Prior to Visit  Medication Sig   busPIRone (BUSPAR) 5 MG tablet Take 1 tablet (5 mg total) by mouth 2 (two) times daily as needed.   cyclobenzaprine (FLEXERIL) 10 MG tablet Take 1 tablet (10 mg total) by mouth 3 (three) times daily as needed for muscle spasms.   DULoxetine (CYMBALTA) 60 MG capsule Take 1 capsule by mouth daily.   fluticasone (FLONASE) 50 MCG/ACT nasal spray Place into the nose.   hydrOXYzine (ATARAX) 50 MG tablet Take 1 tablet (50 mg total) by mouth 3 (three) times daily as needed for anxiety.   ibuprofen (ADVIL) 800 MG tablet Take 1 tablet by mouth every 8 hours with food or milk as needed   No facility-administered medications prior to visit.    Review of Systems  {Labs  Heme  Chem  Endocrine  Serology   Results Review (optional):23779}   Objective    There were no vitals taken for this visit.  {Show previous vital signs (optional):23777}   Physical Exam     Assessment & Plan     ***  No follow-ups on file.     I discussed the assessment and treatment plan with the patient. The patient was provided an opportunity to ask questions and all were answered. The patient agreed with the plan and demonstrated an understanding of the instructions.   The patient was advised to call back or seek an in-person evaluation if the symptoms worsen or if the condition fails to improve as anticipated.  I provided *** minutes of non-face-to-face time during this encounter.  {provider attestation***:1}  Eulis Foster, MD [facility] [phone] (phone) [phone] (fax)  [place]

## 2022-07-19 ENCOUNTER — Other Ambulatory Visit: Payer: Self-pay | Admitting: Family Medicine

## 2022-07-19 ENCOUNTER — Encounter: Payer: Self-pay | Admitting: Family Medicine

## 2022-07-19 ENCOUNTER — Telehealth (INDEPENDENT_AMBULATORY_CARE_PROVIDER_SITE_OTHER): Payer: 59 | Admitting: Family Medicine

## 2022-07-19 ENCOUNTER — Other Ambulatory Visit (HOSPITAL_COMMUNITY): Payer: Self-pay

## 2022-07-19 DIAGNOSIS — F4310 Post-traumatic stress disorder, unspecified: Secondary | ICD-10-CM | POA: Diagnosis not present

## 2022-07-19 DIAGNOSIS — S134XXA Sprain of ligaments of cervical spine, initial encounter: Secondary | ICD-10-CM | POA: Diagnosis not present

## 2022-07-19 DIAGNOSIS — M419 Scoliosis, unspecified: Secondary | ICD-10-CM | POA: Diagnosis not present

## 2022-07-19 DIAGNOSIS — G8929 Other chronic pain: Secondary | ICD-10-CM | POA: Diagnosis not present

## 2022-07-19 DIAGNOSIS — M545 Low back pain, unspecified: Secondary | ICD-10-CM

## 2022-07-19 DIAGNOSIS — F603 Borderline personality disorder: Secondary | ICD-10-CM | POA: Diagnosis not present

## 2022-07-19 MED ORDER — DULOXETINE HCL 60 MG PO CPEP
60.0000 mg | ORAL_CAPSULE | Freq: Every day | ORAL | 1 refills | Status: DC
  Filled 2022-07-19 – 2022-08-06 (×2): qty 90, 90d supply, fill #0
  Filled 2022-11-09: qty 90, 90d supply, fill #1

## 2022-07-19 NOTE — Assessment & Plan Note (Signed)
Chronic low back pain 2/2 to scoliosis  Previous tried mechanical unit but did not report improved pain with the device She has been managing flares of back pain with muscle relaxer, NSAIDS, heat therapy and rest  Denies pain today  Has not used exercises provided as of yet  Would ultimately prefer to start PT when she has a more flexible schedule  Will continue with therapeutic measures as above, follow up in 4 months

## 2022-07-20 ENCOUNTER — Other Ambulatory Visit (HOSPITAL_BASED_OUTPATIENT_CLINIC_OR_DEPARTMENT_OTHER): Payer: Self-pay

## 2022-07-20 ENCOUNTER — Other Ambulatory Visit (HOSPITAL_COMMUNITY): Payer: Self-pay

## 2022-07-20 MED ORDER — FEXOFENADINE-PSEUDOEPHED ER 60-120 MG PO TB12
1.0000 | ORAL_TABLET | Freq: Two times a day (BID) | ORAL | 0 refills | Status: AC
  Filled 2022-07-20 – 2022-07-21 (×2): qty 20, 10d supply, fill #0

## 2022-07-20 MED ORDER — FLUTICASONE PROPIONATE 50 MCG/ACT NA SUSP
2.0000 | Freq: Every day | NASAL | 6 refills | Status: DC
Start: 2022-07-20 — End: 2022-12-02
  Filled 2022-07-20 – 2022-08-06 (×2): qty 16, 30d supply, fill #0

## 2022-07-21 ENCOUNTER — Other Ambulatory Visit (HOSPITAL_COMMUNITY): Payer: Self-pay

## 2022-07-22 ENCOUNTER — Other Ambulatory Visit (HOSPITAL_COMMUNITY): Payer: Self-pay

## 2022-07-23 ENCOUNTER — Other Ambulatory Visit (HOSPITAL_COMMUNITY): Payer: Self-pay

## 2022-07-26 DIAGNOSIS — F4310 Post-traumatic stress disorder, unspecified: Secondary | ICD-10-CM | POA: Diagnosis not present

## 2022-07-26 DIAGNOSIS — F603 Borderline personality disorder: Secondary | ICD-10-CM | POA: Diagnosis not present

## 2022-07-27 ENCOUNTER — Other Ambulatory Visit (HOSPITAL_BASED_OUTPATIENT_CLINIC_OR_DEPARTMENT_OTHER): Payer: Self-pay

## 2022-07-30 ENCOUNTER — Other Ambulatory Visit (HOSPITAL_COMMUNITY): Payer: Self-pay

## 2022-08-03 DIAGNOSIS — F4310 Post-traumatic stress disorder, unspecified: Secondary | ICD-10-CM | POA: Diagnosis not present

## 2022-08-03 DIAGNOSIS — F603 Borderline personality disorder: Secondary | ICD-10-CM | POA: Diagnosis not present

## 2022-08-06 ENCOUNTER — Other Ambulatory Visit (HOSPITAL_COMMUNITY): Payer: Self-pay

## 2022-08-06 ENCOUNTER — Other Ambulatory Visit: Payer: Self-pay

## 2022-08-06 DIAGNOSIS — M419 Scoliosis, unspecified: Secondary | ICD-10-CM | POA: Diagnosis not present

## 2022-08-06 DIAGNOSIS — J302 Other seasonal allergic rhinitis: Secondary | ICD-10-CM | POA: Diagnosis not present

## 2022-08-06 DIAGNOSIS — Z3169 Encounter for other general counseling and advice on procreation: Secondary | ICD-10-CM | POA: Diagnosis not present

## 2022-08-06 MED ORDER — LEVOCETIRIZINE DIHYDROCHLORIDE 5 MG PO TABS
5.0000 mg | ORAL_TABLET | Freq: Every day | ORAL | 11 refills | Status: DC
  Filled 2022-08-06: qty 30, 30d supply, fill #0

## 2022-08-10 DIAGNOSIS — F4310 Post-traumatic stress disorder, unspecified: Secondary | ICD-10-CM | POA: Diagnosis not present

## 2022-08-10 DIAGNOSIS — F603 Borderline personality disorder: Secondary | ICD-10-CM | POA: Diagnosis not present

## 2022-08-17 DIAGNOSIS — F4310 Post-traumatic stress disorder, unspecified: Secondary | ICD-10-CM | POA: Diagnosis not present

## 2022-08-17 DIAGNOSIS — F603 Borderline personality disorder: Secondary | ICD-10-CM | POA: Diagnosis not present

## 2022-08-24 DIAGNOSIS — F603 Borderline personality disorder: Secondary | ICD-10-CM | POA: Diagnosis not present

## 2022-08-24 DIAGNOSIS — F4310 Post-traumatic stress disorder, unspecified: Secondary | ICD-10-CM | POA: Diagnosis not present

## 2022-08-31 DIAGNOSIS — F4310 Post-traumatic stress disorder, unspecified: Secondary | ICD-10-CM | POA: Diagnosis not present

## 2022-08-31 DIAGNOSIS — F603 Borderline personality disorder: Secondary | ICD-10-CM | POA: Diagnosis not present

## 2022-09-06 IMAGING — US US EXTREM LOW VENOUS
1 series · 14 of 24 positions shown · non-contrast
Comparison: None Available.

CLINICAL DATA: 31-year-old female with bilateral lower extremity
swelling after long travel. Pain and edema.

EXAM:
BILATERAL LOWER EXTREMITY VENOUS DOPPLER ULTRASOUND
TECHNIQUE: Gray-scale sonography with compression, as well as color and duplex
ultrasound, were performed to evaluate the deep venous system(s)
from the level of the common femoral vein through the popliteal and
proximal calf veins.

[Series 1: us venous img lower bilat (dvt) · portal-venous · 14 of 82 slices shown]
[im 1/82]
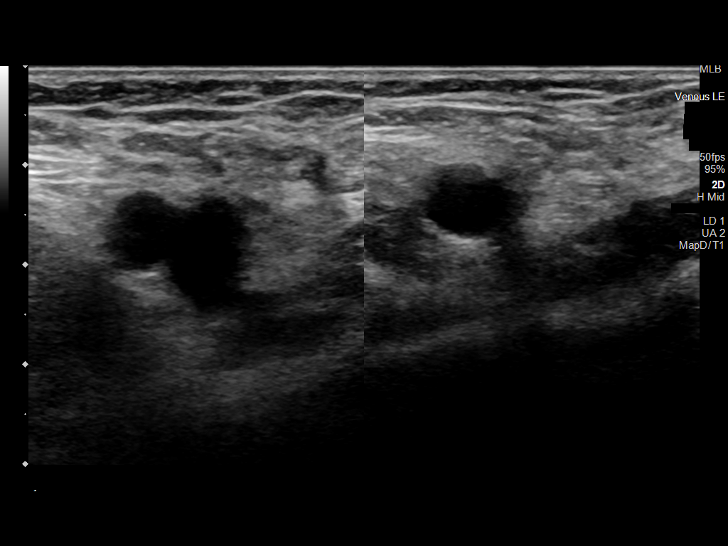
[im 8/82]
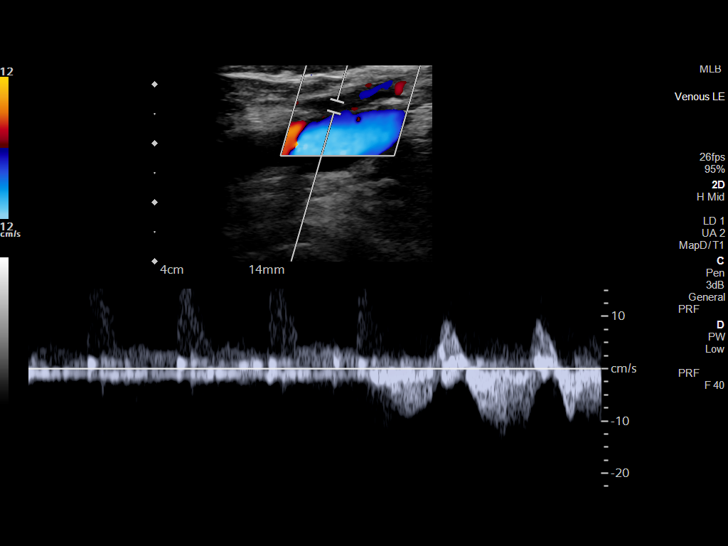
[im 15/82]
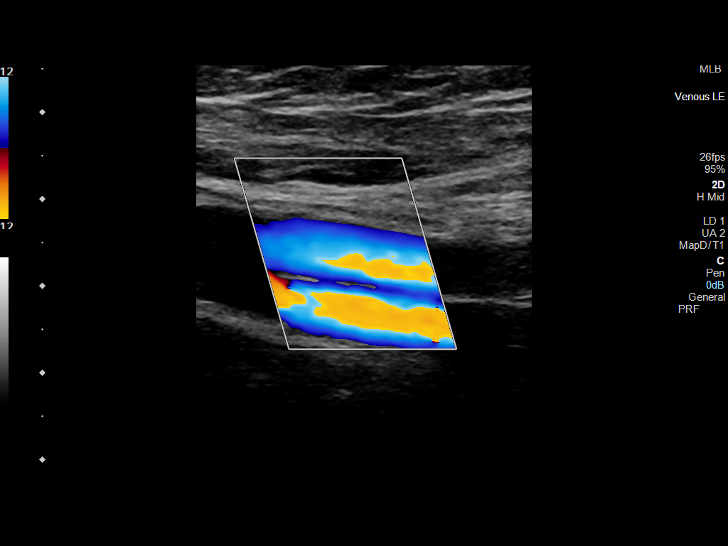
[im 22/82]
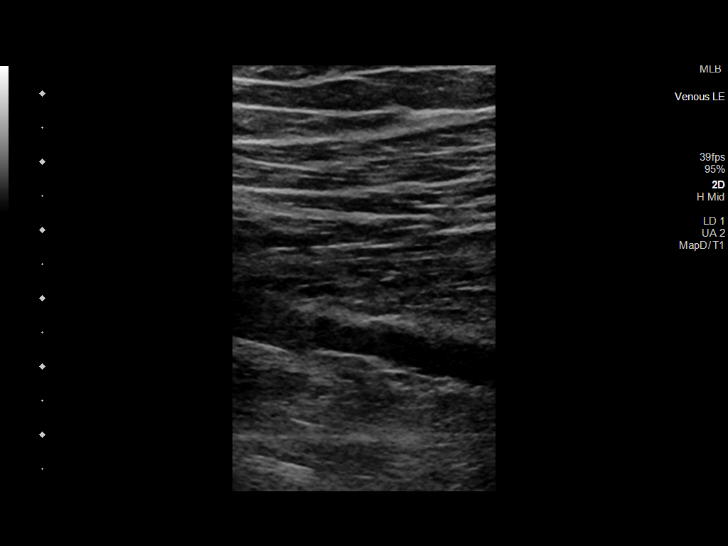
[im 25/82]
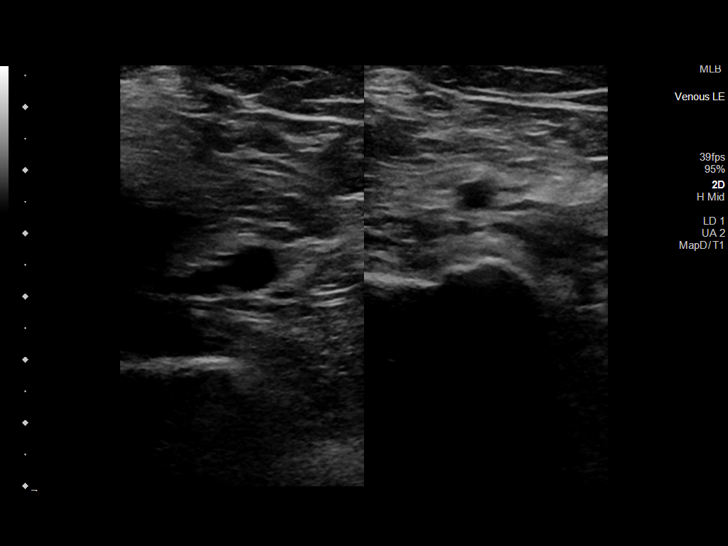
[im 32/82]
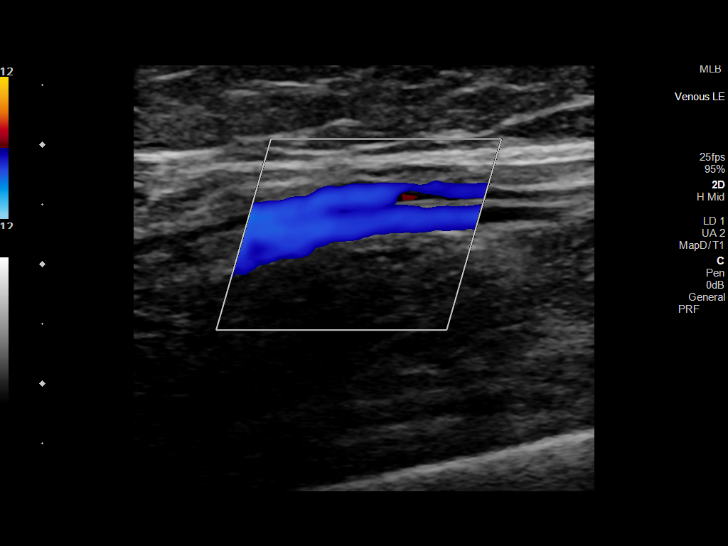
[im 39/82]
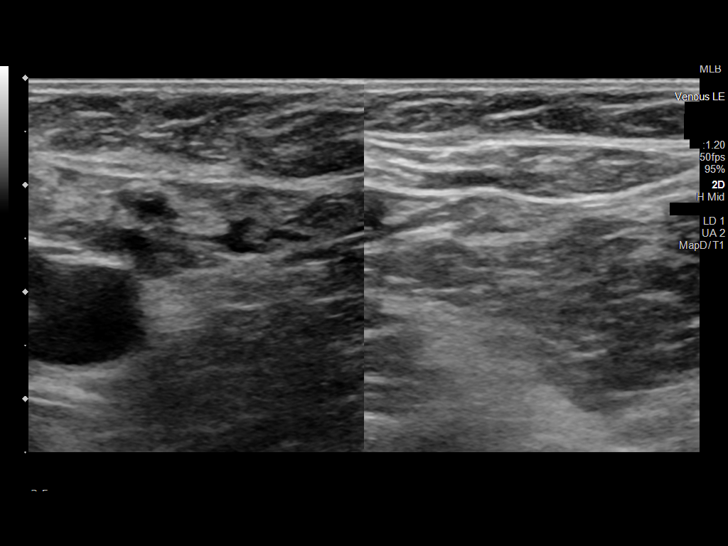
[im 43/82]
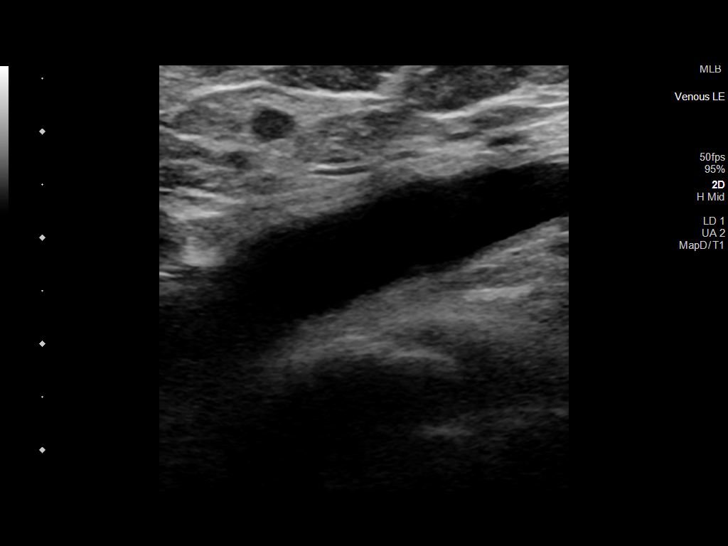
[im 50/82]
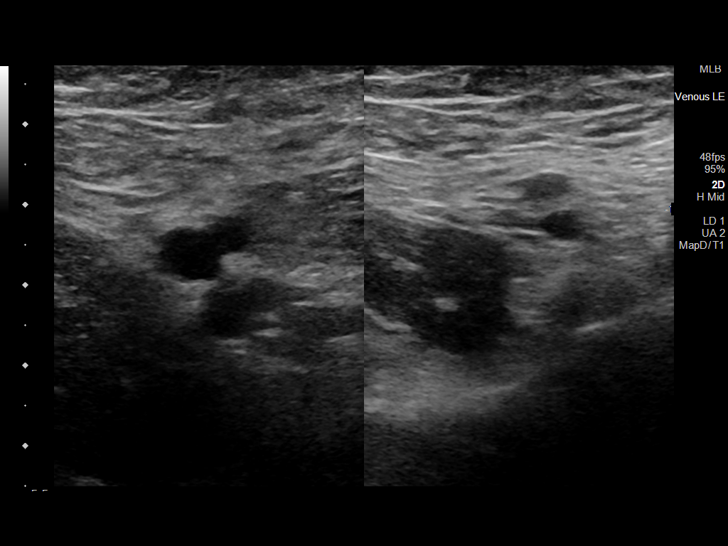
[im 57/82]
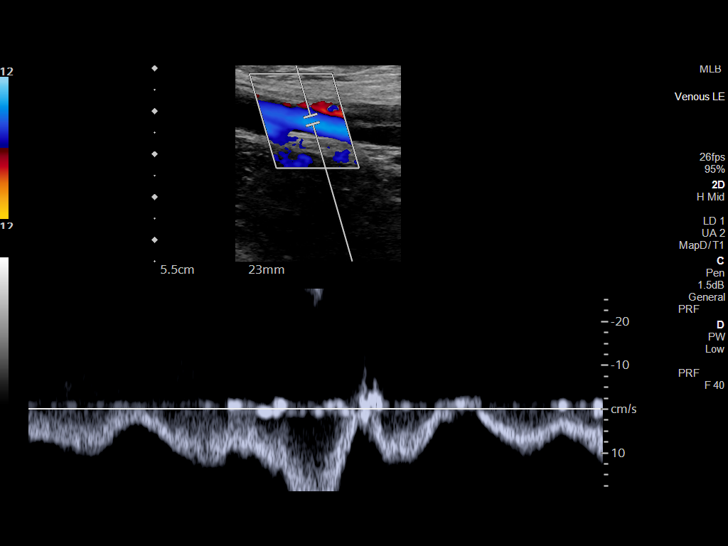
[im 64/82]
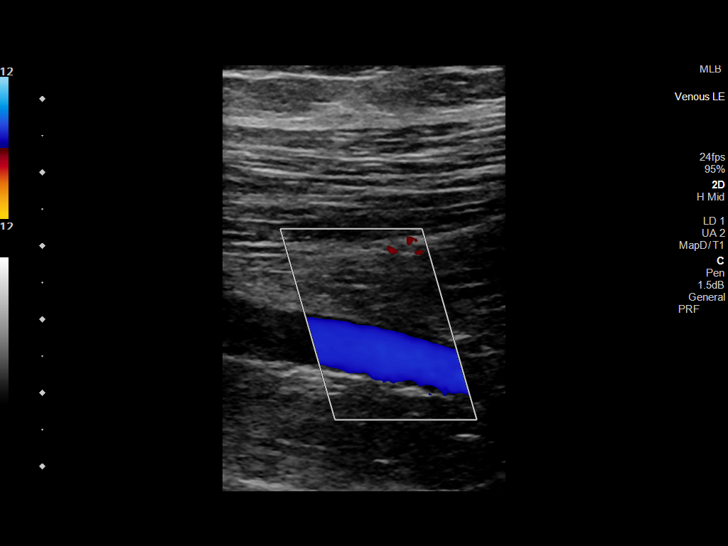
[im 67/82]
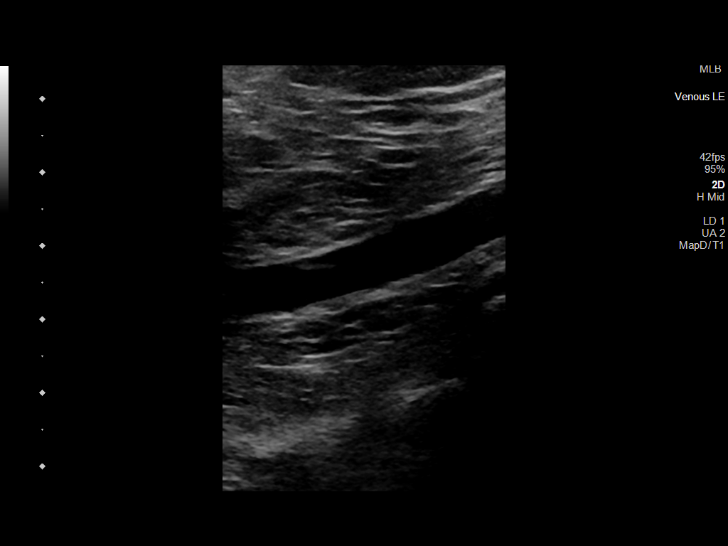
[im 74/82]
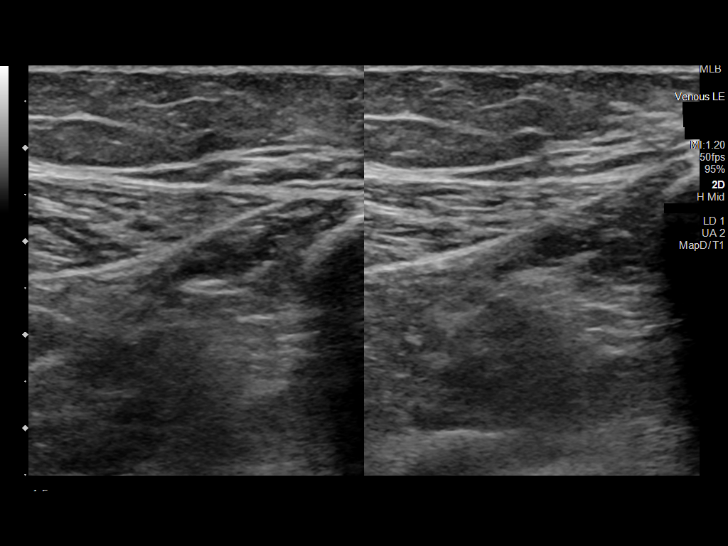
[im 82/82]
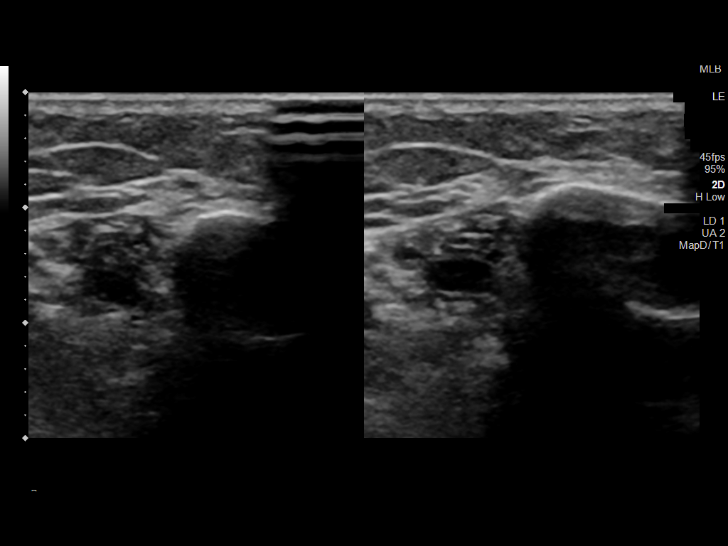

[14 of 24 positions shown; findings below may reference images not displayed]

FINDINGS: VENOUS

Normal compressibility of the bilateral common femoral, superficial
femoral, and popliteal veins, as well as the visualized calf veins.
Visualized portions of bilateral profunda femoral vein and great
saphenous vein unremarkable. No filling defects to suggest DVT on
grayscale or color Doppler imaging. Doppler waveforms show normal
direction of venous flow, normal respiratory plasticity and response
to augmentation.

OTHER

None.

Limitations: none
IMPRESSION: No evidence of bilateral lower extremity deep venous thrombosis.

## 2022-09-24 DIAGNOSIS — F4310 Post-traumatic stress disorder, unspecified: Secondary | ICD-10-CM | POA: Diagnosis not present

## 2022-09-24 DIAGNOSIS — F603 Borderline personality disorder: Secondary | ICD-10-CM | POA: Diagnosis not present

## 2022-09-28 DIAGNOSIS — F4310 Post-traumatic stress disorder, unspecified: Secondary | ICD-10-CM | POA: Diagnosis not present

## 2022-09-28 DIAGNOSIS — F603 Borderline personality disorder: Secondary | ICD-10-CM | POA: Diagnosis not present

## 2022-10-08 DIAGNOSIS — F4310 Post-traumatic stress disorder, unspecified: Secondary | ICD-10-CM | POA: Diagnosis not present

## 2022-10-08 DIAGNOSIS — F603 Borderline personality disorder: Secondary | ICD-10-CM | POA: Diagnosis not present

## 2022-10-11 NOTE — Progress Notes (Unsigned)
    MyChart Video Visit    Virtual Visit via Video Note   This format is felt to be most appropriate for this patient at this time. Physical exam was limited by quality of the video and audio technology used for the visit.   Patient location: *** Provider location: ***  I discussed the limitations of evaluation and management by telemedicine and the availability of in person appointments. The patient expressed understanding and agreed to proceed.  Patient: Amy Barrett   DOB: Nov 23, 1990   32 y.o. Female  MRN: [id number] Visit Date: 10/12/2022  Today's healthcare provider: Ronnald Ramp, MD   No chief complaint on file.  Subjective    HPI   Digestive Concerns ***   Medications: Outpatient Medications Prior to Visit  Medication Sig   cyclobenzaprine (FLEXERIL) 10 MG tablet Take 1 tablet (10 mg total) by mouth 3 (three) times daily as needed for muscle spasms.   DULoxetine (CYMBALTA) 60 MG capsule Take 1 capsule by mouth daily.   fluticasone (FLONASE) 50 MCG/ACT nasal spray Place 2 sprays into both nostrils daily.   hydrOXYzine (ATARAX) 50 MG tablet Take 1 tablet (50 mg total) by mouth 3 (three) times daily as needed for anxiety.   ibuprofen (ADVIL) 800 MG tablet Take 1 tablet by mouth every 8 hours with food or milk as needed   levocetirizine (XYZAL) 5 MG tablet Take 1 tablet (5 mg total) by mouth at bedtime.   No facility-administered medications prior to visit.    Review of Systems  {Labs  Heme  Chem  Endocrine  Serology  Results Review (optional):23779}   Objective    There were no vitals taken for this visit.  {Show previous vital signs (optional):23777}   Physical Exam     Assessment & Plan     Problem List Items Addressed This Visit   None    No follow-ups on file.     I discussed the assessment and treatment plan with the patient. The patient was provided an opportunity to ask questions and all were answered. The patient agreed  with the plan and demonstrated an understanding of the instructions.   The patient was advised to call back or seek an in-person evaluation if the symptoms worsen or if the condition fails to improve as anticipated.  I provided *** minutes of non-face-to-face time during this encounter.  {provider attestation***:1}  Ronnald Ramp, MD [facility] [phone] (phone) [phone] (fax)  [facility]

## 2022-10-12 ENCOUNTER — Telehealth (INDEPENDENT_AMBULATORY_CARE_PROVIDER_SITE_OTHER): Payer: 59 | Admitting: Family Medicine

## 2022-10-12 ENCOUNTER — Encounter: Payer: Self-pay | Admitting: Family Medicine

## 2022-10-12 DIAGNOSIS — Z91199 Patient's noncompliance with other medical treatment and regimen due to unspecified reason: Secondary | ICD-10-CM

## 2022-10-13 ENCOUNTER — Encounter: Payer: Self-pay | Admitting: Family Medicine

## 2022-10-13 DIAGNOSIS — F4310 Post-traumatic stress disorder, unspecified: Secondary | ICD-10-CM | POA: Diagnosis not present

## 2022-10-13 DIAGNOSIS — F603 Borderline personality disorder: Secondary | ICD-10-CM | POA: Diagnosis not present

## 2022-10-20 DIAGNOSIS — F4310 Post-traumatic stress disorder, unspecified: Secondary | ICD-10-CM | POA: Diagnosis not present

## 2022-10-20 DIAGNOSIS — F603 Borderline personality disorder: Secondary | ICD-10-CM | POA: Diagnosis not present

## 2022-10-27 DIAGNOSIS — F4310 Post-traumatic stress disorder, unspecified: Secondary | ICD-10-CM | POA: Diagnosis not present

## 2022-10-27 DIAGNOSIS — F603 Borderline personality disorder: Secondary | ICD-10-CM | POA: Diagnosis not present

## 2022-11-02 DIAGNOSIS — F4310 Post-traumatic stress disorder, unspecified: Secondary | ICD-10-CM | POA: Diagnosis not present

## 2022-11-02 DIAGNOSIS — F603 Borderline personality disorder: Secondary | ICD-10-CM | POA: Diagnosis not present

## 2022-11-09 ENCOUNTER — Other Ambulatory Visit: Payer: Self-pay

## 2022-11-09 ENCOUNTER — Other Ambulatory Visit (HOSPITAL_COMMUNITY): Payer: Self-pay

## 2022-11-10 ENCOUNTER — Other Ambulatory Visit (HOSPITAL_COMMUNITY): Payer: Self-pay

## 2022-11-10 MED ORDER — IBUPROFEN 800 MG PO TABS
800.0000 mg | ORAL_TABLET | Freq: Three times a day (TID) | ORAL | 0 refills | Status: DC | PRN
  Filled 2022-11-10: qty 30, 10d supply, fill #0

## 2022-11-12 ENCOUNTER — Telehealth (INDEPENDENT_AMBULATORY_CARE_PROVIDER_SITE_OTHER): Payer: 59 | Admitting: Family Medicine

## 2022-11-12 ENCOUNTER — Encounter: Payer: Self-pay | Admitting: Family Medicine

## 2022-11-12 DIAGNOSIS — K5909 Other constipation: Secondary | ICD-10-CM

## 2022-11-12 DIAGNOSIS — Z0000 Encounter for general adult medical examination without abnormal findings: Secondary | ICD-10-CM

## 2022-11-12 DIAGNOSIS — M419 Scoliosis, unspecified: Secondary | ICD-10-CM

## 2022-11-12 NOTE — Patient Instructions (Signed)
Senna and Miralax to help with bowel movements    Please discuss when you would be eligible for

## 2022-11-12 NOTE — Progress Notes (Signed)
MyChart Video Visit    Virtual Visit via Video Note   This format is felt to be most appropriate for this patient at this time. Physical exam was limited by quality of the video and audio technology used for the visit.   Patient location: patient home address   Provider location: [facility]  [address], [address]  [place], [place] [address]   I discussed the limitations of evaluation and management by telemedicine and the availability of in person appointments. The patient expressed understanding and agreed to proceed.  Patient: Amy Barrett   DOB: [birth year]   32 y.o. Female  MRN: [id number] Visit Date: 11/12/2022  Today's healthcare provider: Ronnald Ramp, MD   Chief Complaint  Patient presents with   Scoliosis   Subjective    HPI HPI   Patient is having virtual follow up today, states to not have any questions or concerns for today. Last edited by Lubertha Basque, CMA on 11/12/2022  9:44 AM.      Discussed the use of AI scribe software for clinical note transcription with the patient, who gave verbal consent to proceed.  History of Present Illness    Constipation & Abdominal Bloating  The patient, with a known history of scoliosis, presents with complaints of chronic constipation and bloating. She reports using Miralax daily, which has provided some relief, but she still experiences a sensation of bloating and infrequent bowel movements. The patient reports bowel movements every other day or every two days, which she recognizes as less frequent than ideal. She also reports occasional episodes of severe cramping that wake her from sleep, accompanied by sweating and an urgent need to defecate. These episodes are infrequent and seem to occur randomly, sometimes during night shifts.     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   DULoxetine (CYMBALTA) 60 MG capsule Take 1 capsule by mouth daily.   ibuprofen (ADVIL) 800 MG tablet Take 1 tablet by mouth every 8 hours with food or milk as needed   ibuprofen (ADVIL) 800 MG tablet Take 1 tablet (800 mg total) by mouth with food or milk every 8 (eight) hours as needed   levocetirizine (XYZAL) 5 MG tablet Take 1 tablet (5 mg total) by mouth at bedtime.   fluticasone (FLONASE) 50 MCG/ACT nasal spray Place 2 sprays into both nostrils daily. (Patient not taking: Reported on 11/12/2022)   hydrOXYzine (ATARAX) 50 MG tablet Take 1 tablet (50 mg total) by mouth 3 (three) times daily as needed for anxiety. (Patient not taking: Reported on 11/12/2022)   No facility-administered medications prior to visit.    Review of Systems     Objective    There were no vitals taken for this visit.     Physical Exam     Assessment & Plan  Problem List Items Addressed This Visit     Other constipation - Primary    Infrequent bowel movements, bloating, and occasional abdominal cramps. Currently taking Miralax with some improvement. Discussed the importance of hydration, diet, and physical activity in managing constipation. Also discussed the use of Senna as an additional laxative if needed. -Increase Miralax as needed, up to 2-3 times daily. -Consider adding Senna if needed for additional relief. -Continue to hydrate adequately,  aiming for half body weight in ounces of water daily. -Increase intake of green leafy vegetables. -Consider keeping a log of bowel movements and any associated symptoms.      Scoliosis    Chronic Stable Continue as needed NSAIDs, heating pad, rest and FMLA as needed She will be due for recertification in November 2024 Will schedule follow-up in November for this She is currently asymptomatic       Return in about 4 months (around 03/15/2023) for [facility] recertification for back .     I discussed the assessment and treatment plan with the patient. The patient was provided an opportunity to ask questions and all were answered. The patient agreed with the plan and demonstrated an understanding of the instructions.   The patient was advised to call back or seek an in-person evaluation if the symptoms worsen or if the condition fails to improve as anticipated.  I provided 15 minutes of non-face-to-face time during this encounter.   Ronnald Ramp, MD [facility] [phone] (phone) [phone] (fax)  [facility]

## 2022-11-12 NOTE — Assessment & Plan Note (Signed)
Discussed the need for an annual physical with primary care provider in addition to well woman exam with gynecologist. Patient to confirm with insurance. -Contact insurance to confirm coverage for annual physical with primary care provider. -Schedule annual physical based on insurance confirmation.

## 2022-11-12 NOTE — Assessment & Plan Note (Signed)
Chronic Stable Continue as needed NSAIDs, heating pad, rest and FMLA as needed She will be due for recertification in November 2024 Will schedule follow-up in November for this She is currently asymptomatic

## 2022-11-12 NOTE — Assessment & Plan Note (Signed)
Infrequent bowel movements, bloating, and occasional abdominal cramps. Currently taking Miralax with some improvement. Discussed the importance of hydration, diet, and physical activity in managing constipation. Also discussed the use of Senna as an additional laxative if needed. -Increase Miralax as needed, up to 2-3 times daily. -Consider adding Senna if needed for additional relief. -Continue to hydrate adequately, aiming for half body weight in ounces of water daily. -Increase intake of green leafy vegetables. -Consider keeping a log of bowel movements and any associated symptoms.

## 2022-11-13 ENCOUNTER — Other Ambulatory Visit (HOSPITAL_COMMUNITY): Payer: Self-pay

## 2022-11-17 DIAGNOSIS — F603 Borderline personality disorder: Secondary | ICD-10-CM | POA: Diagnosis not present

## 2022-11-17 DIAGNOSIS — F4310 Post-traumatic stress disorder, unspecified: Secondary | ICD-10-CM | POA: Diagnosis not present

## 2022-11-24 DIAGNOSIS — F603 Borderline personality disorder: Secondary | ICD-10-CM | POA: Diagnosis not present

## 2022-11-24 DIAGNOSIS — F4310 Post-traumatic stress disorder, unspecified: Secondary | ICD-10-CM | POA: Diagnosis not present

## 2022-12-01 DIAGNOSIS — Z6826 Body mass index (BMI) 26.0-26.9, adult: Secondary | ICD-10-CM | POA: Insufficient documentation

## 2022-12-01 DIAGNOSIS — Z0000 Encounter for general adult medical examination without abnormal findings: Secondary | ICD-10-CM | POA: Insufficient documentation

## 2022-12-01 NOTE — Progress Notes (Unsigned)
Complete physical exam   Patient: Amy Barrett   DOB: 27-Apr-1991   32 y.o. Female  MRN: [id number] Visit Date: 12/02/2022  Today's healthcare provider: Ronnald Ramp, MD   No chief complaint on file.  Subjective    Amy Barrett is a 32 y.o. female who presents today for a complete physical exam.   She reports consuming a {diet types:17450} diet.   {Exercise:19826} She generally feels {well/fairly well/poorly:18703}.   She reports sleeping {well/fairly well/poorly:18703}.    She {does/does not:200015} have additional problems to discuss today.     Past Medical History:  Diagnosis Date   Allergy    BV (bacterial vaginosis)    Dysmenorrhea since adolescence   [place] in 2011 that did not show any acute findings. Seen by Dr. Vickey Sages   Scoliosis    Yeast vaginitis    No past surgical history on file. Social History   Socioeconomic History   Marital status: Single    Spouse name: Not on file   Number of children: Not on file   Years of education: Not on file   Highest education level: Not on file  Occupational History   Not on file  Tobacco Use   Smoking status: Former    Types: Cigars   Smokeless tobacco: Current   Tobacco comments:    Black and miles  Vaping Use   Vaping status: Never Used  Substance and Sexual Activity   Alcohol use: Yes    Alcohol/week: 0.0 standard drinks of alcohol    Comment: occasional, social   Drug use: No   Sexual activity: Not Currently    Partners: Male    Birth control/protection: Injection    Comment: intercourse age 30, more than 5 sexual partners  Other Topics Concern   Not on file  Social History Narrative   Not on file   Social Determinants of Health   Financial Resource Strain: Not on file  Food Insecurity: Not on file  Transportation Needs: Not on file  Physical Activity: Not on file  Stress: Not on file  Social Connections: Not on file  Intimate Partner Violence: Not on file   Family Status   Relation Name Status   Mother  Alive   Father  Alive   Son  (Not Specified)   Mat Uncle  Alive   Pat Aunt  Deceased   Pat Uncle  Alive   MGM  Alive   MGF  Deceased  No partnership data on file   Family History  Problem Relation Age of Onset   Heart disease Mother    Hyperlipidemia Mother    Hypertension Mother    Emphysema Father    Breast cancer Son    Breast cancer Paternal Uncle    Cancer Paternal Uncle    Breast cancer Maternal Grandmother 65   Hypertension Maternal Grandmother    No Known Allergies   Medications: Outpatient Medications Prior to Visit  Medication Sig   cyclobenzaprine (FLEXERIL) 10 MG tablet Take 1 tablet (10 mg total) by mouth 3 (three) times daily as needed for muscle spasms.   DULoxetine (CYMBALTA) 60 MG capsule Take 1 capsule by mouth daily.   fluticasone (FLONASE) 50 MCG/ACT nasal spray Place 2 sprays into both nostrils daily. (Patient not taking: Reported on 11/12/2022)   hydrOXYzine (ATARAX) 50 MG tablet Take 1 tablet (50 mg total) by mouth 3 (three) times daily as needed for anxiety. (Patient not taking: Reported on 11/12/2022)   ibuprofen (ADVIL)  800 MG tablet Take 1 tablet by mouth every 8 hours with food or milk as needed   ibuprofen (ADVIL) 800 MG tablet Take 1 tablet (800 mg total) by mouth with food or milk every 8 (eight) hours as needed   levocetirizine (XYZAL) 5 MG tablet Take 1 tablet (5 mg total) by mouth at bedtime.   No facility-administered medications prior to visit.    Review of Systems  {Insert previous labs (optional):23779}  {See past labs  Heme  Chem  Endocrine  Serology  Results Review (optional):1}  Objective    There were no vitals taken for this visit. {Insert last BP/Wt (optional):23777}  {See vitals history (optional):1}   Physical Exam  ***  Last depression screening scores    03/19/2022    9:55 AM 02/05/2022    9:28 AM 10/06/2021   10:15 AM  PHQ 2/9 Scores  PHQ - 2 Score 0 1 0  PHQ- 9 Score 0 4 2     Last fall risk screening    03/19/2022    9:55 AM  Fall Risk   Falls in the past year? 0  Number falls in past yr: 0  Injury with Fall? 0  Risk for fall due to : No Fall Risks    Last Audit-C alcohol use screening    03/19/2022    9:55 AM  Alcohol Use Disorder Test (AUDIT)  1. How often do you have a drink containing alcohol? 1  2. How many drinks containing alcohol do you have on a typical day when you are drinking? 0  3. How often do you have six or more drinks on one occasion? 0  AUDIT-C Score 1   A score of 3 or more in women, and 4 or more in men indicates increased risk for alcohol abuse, EXCEPT if all of the points are from question 1   No results found for any visits on 12/02/22.  Assessment & Plan    Routine Health Maintenance and Physical Exam  Immunization History  Administered Date(s) Administered   Influenza,inj,Quad PF,6+ Mos 02/14/2013   Influenza,inj,Quad PF,6-35 Mos 02/11/2017   Influenza-Unspecified 02/13/2019, 01/28/2020, 01/28/2022   PFIZER(Purple Top)SARS-COV-2 Vaccination 08/24/2019, 09/21/2019, 04/16/2020   PPD Test 10/06/2021   Tdap 10/23/2020    Health Maintenance  Topic Date Due   PAP SMEAR-Modifier  07/12/2020   COVID-19 Vaccine (4 - 2023-24 season) 01/01/2022   INFLUENZA VACCINE  12/02/2022   DTaP/Tdap/Td (2 - Td or Tdap) 10/24/2030   Hepatitis C Screening  Completed   HIV Screening  Completed   HPV VACCINES  Aged Out    Problem List Items Addressed This Visit   None    No follow-ups on file.       Ronnald Ramp, MD  [facility] [phone] (phone) [phone] (fax)  [facility]

## 2022-12-02 ENCOUNTER — Encounter: Payer: Self-pay | Admitting: Family Medicine

## 2022-12-02 ENCOUNTER — Other Ambulatory Visit (HOSPITAL_COMMUNITY): Payer: Self-pay

## 2022-12-02 ENCOUNTER — Ambulatory Visit (INDEPENDENT_AMBULATORY_CARE_PROVIDER_SITE_OTHER): Payer: 59 | Admitting: Family Medicine

## 2022-12-02 VITALS — BP 110/70 | HR 73 | Temp 98.2°F | Resp 12 | Ht 67.0 in | Wt 171.9 lb

## 2022-12-02 DIAGNOSIS — Z0000 Encounter for general adult medical examination without abnormal findings: Secondary | ICD-10-CM

## 2022-12-02 DIAGNOSIS — G8929 Other chronic pain: Secondary | ICD-10-CM | POA: Diagnosis not present

## 2022-12-02 DIAGNOSIS — Z130 Encounter for screening for diseases of the blood and blood-forming organs and certain disorders involving the immune mechanism: Secondary | ICD-10-CM | POA: Diagnosis not present

## 2022-12-02 DIAGNOSIS — Z13220 Encounter for screening for lipoid disorders: Secondary | ICD-10-CM | POA: Diagnosis not present

## 2022-12-02 DIAGNOSIS — M419 Scoliosis, unspecified: Secondary | ICD-10-CM | POA: Diagnosis not present

## 2022-12-02 DIAGNOSIS — E663 Overweight: Secondary | ICD-10-CM

## 2022-12-02 DIAGNOSIS — M545 Low back pain, unspecified: Secondary | ICD-10-CM | POA: Diagnosis not present

## 2022-12-02 DIAGNOSIS — Z131 Encounter for screening for diabetes mellitus: Secondary | ICD-10-CM

## 2022-12-02 DIAGNOSIS — Z6826 Body mass index (BMI) 26.0-26.9, adult: Secondary | ICD-10-CM | POA: Diagnosis not present

## 2022-12-02 DIAGNOSIS — S134XXA Sprain of ligaments of cervical spine, initial encounter: Secondary | ICD-10-CM

## 2022-12-02 MED ORDER — DULOXETINE HCL 60 MG PO CPEP
60.0000 mg | ORAL_CAPSULE | Freq: Every day | ORAL | 3 refills | Status: DC
  Filled 2022-12-02 – 2023-02-26 (×3): qty 90, 90d supply, fill #0
  Filled 2023-06-20 (×2): qty 90, 90d supply, fill #1
  Filled 2023-09-21: qty 90, 90d supply, fill #2

## 2022-12-02 NOTE — Assessment & Plan Note (Signed)
Chronic  Stable  Has completed PT in the past  Will be due for [facility] recertification in Nov  Plan to follow up in Dec 2024

## 2022-12-02 NOTE — Assessment & Plan Note (Addendum)
Chronic conditions are stable  Patient was counseled on benefits of regular physical activity with goal of 150 minutes of moderate to vigurous intensity 4 days per week  Patient was counseled to consume well balanced diet of fruits, vegetables, limited saturated fats and limited sugary foods and beverages with emphasis on consuming 6-8 glasses of water daily  Screening recommended today: A1c, lipids,CBC, lipids, urine analysis Vaccines recommended today: COVID and influenza vaccines at pharmacy  Follows with GYN for pap smear

## 2022-12-02 NOTE — Assessment & Plan Note (Signed)
Chronic  Stable  2/2 to scoliosis  Has FMLA

## 2022-12-02 NOTE — Assessment & Plan Note (Addendum)
Chronic, BMI 26.92 Filed Weights   12/02/22 0828  Weight: 171 lb 14.4 oz (78 kg)   Exercise regimen includes no purposeful exercise currently  Encouraged patient to drink 6-8 8oz glasses of water daily and aim for 150 mins of moderate intensity exercise per week  Recommended balanced diet including protein, limiting saturated fats and consuming sugary beverages and desserts in moderation  CMP, TSH, lipid panel and A1c collected  Urine studies collected

## 2022-12-03 ENCOUNTER — Encounter: Payer: Self-pay | Admitting: Family Medicine

## 2022-12-14 DIAGNOSIS — F603 Borderline personality disorder: Secondary | ICD-10-CM | POA: Diagnosis not present

## 2022-12-14 DIAGNOSIS — F4310 Post-traumatic stress disorder, unspecified: Secondary | ICD-10-CM | POA: Diagnosis not present

## 2022-12-28 DIAGNOSIS — F4310 Post-traumatic stress disorder, unspecified: Secondary | ICD-10-CM | POA: Diagnosis not present

## 2022-12-28 DIAGNOSIS — F603 Borderline personality disorder: Secondary | ICD-10-CM | POA: Diagnosis not present

## 2023-01-11 DIAGNOSIS — F603 Borderline personality disorder: Secondary | ICD-10-CM | POA: Diagnosis not present

## 2023-01-11 DIAGNOSIS — F4310 Post-traumatic stress disorder, unspecified: Secondary | ICD-10-CM | POA: Diagnosis not present

## 2023-01-12 DIAGNOSIS — Z01419 Encounter for gynecological examination (general) (routine) without abnormal findings: Secondary | ICD-10-CM | POA: Diagnosis not present

## 2023-01-12 DIAGNOSIS — Z113 Encounter for screening for infections with a predominantly sexual mode of transmission: Secondary | ICD-10-CM | POA: Diagnosis not present

## 2023-01-12 DIAGNOSIS — R87612 Low grade squamous intraepithelial lesion on cytologic smear of cervix (LGSIL): Secondary | ICD-10-CM | POA: Diagnosis not present

## 2023-01-26 ENCOUNTER — Encounter: Payer: Self-pay | Admitting: Family Medicine

## 2023-01-29 ENCOUNTER — Telehealth: Payer: 59 | Admitting: Family Medicine

## 2023-01-29 ENCOUNTER — Other Ambulatory Visit (HOSPITAL_COMMUNITY): Payer: Self-pay

## 2023-01-29 DIAGNOSIS — N390 Urinary tract infection, site not specified: Secondary | ICD-10-CM | POA: Diagnosis not present

## 2023-01-29 MED ORDER — CEPHALEXIN 500 MG PO CAPS
500.0000 mg | ORAL_CAPSULE | Freq: Two times a day (BID) | ORAL | 0 refills | Status: AC
  Filled 2023-01-29: qty 14, 7d supply, fill #0

## 2023-01-29 NOTE — Progress Notes (Signed)

## 2023-01-31 ENCOUNTER — Other Ambulatory Visit: Payer: Self-pay | Admitting: Family Medicine

## 2023-01-31 DIAGNOSIS — G4709 Other insomnia: Secondary | ICD-10-CM

## 2023-02-01 ENCOUNTER — Other Ambulatory Visit (HOSPITAL_COMMUNITY): Payer: Self-pay

## 2023-02-01 DIAGNOSIS — F603 Borderline personality disorder: Secondary | ICD-10-CM | POA: Diagnosis not present

## 2023-02-01 DIAGNOSIS — F4310 Post-traumatic stress disorder, unspecified: Secondary | ICD-10-CM | POA: Diagnosis not present

## 2023-02-01 MED ORDER — CYCLOBENZAPRINE HCL 10 MG PO TABS
10.0000 mg | ORAL_TABLET | Freq: Three times a day (TID) | ORAL | 0 refills | Status: DC | PRN
  Filled 2023-02-01 – 2023-04-13 (×2): qty 90, 30d supply, fill #0

## 2023-02-01 NOTE — Telephone Encounter (Signed)
Requested medications are due for refill today.  yes  Requested medications are on the active medications list.  yes  Last refill. 11/02/2021 #90 0 rf  Future visit scheduled.   no  Notes to clinic.  Refill not delegated.    Requested Prescriptions  Pending Prescriptions Disp Refills   cyclobenzaprine (FLEXERIL) 10 MG tablet 90 tablet 0    Sig: Take 1 tablet (10 mg total) by mouth 3 (three) times daily as needed for muscle spasms.     There is no refill protocol information for this order

## 2023-02-07 DIAGNOSIS — N898 Other specified noninflammatory disorders of vagina: Secondary | ICD-10-CM | POA: Diagnosis not present

## 2023-02-07 DIAGNOSIS — R3989 Other symptoms and signs involving the genitourinary system: Secondary | ICD-10-CM | POA: Diagnosis not present

## 2023-02-08 DIAGNOSIS — F4310 Post-traumatic stress disorder, unspecified: Secondary | ICD-10-CM | POA: Diagnosis not present

## 2023-02-08 DIAGNOSIS — F603 Borderline personality disorder: Secondary | ICD-10-CM | POA: Diagnosis not present

## 2023-02-11 ENCOUNTER — Other Ambulatory Visit (HOSPITAL_COMMUNITY): Payer: Self-pay

## 2023-02-22 DIAGNOSIS — F603 Borderline personality disorder: Secondary | ICD-10-CM | POA: Diagnosis not present

## 2023-02-22 DIAGNOSIS — F4310 Post-traumatic stress disorder, unspecified: Secondary | ICD-10-CM | POA: Diagnosis not present

## 2023-02-26 ENCOUNTER — Other Ambulatory Visit (HOSPITAL_COMMUNITY): Payer: Self-pay

## 2023-03-03 ENCOUNTER — Encounter: Payer: Self-pay | Admitting: Family Medicine

## 2023-03-03 ENCOUNTER — Telehealth: Payer: 59 | Admitting: Family Medicine

## 2023-03-03 VITALS — Ht 67.0 in | Wt 181.0 lb

## 2023-03-03 DIAGNOSIS — E663 Overweight: Secondary | ICD-10-CM

## 2023-03-03 DIAGNOSIS — Z6828 Body mass index (BMI) 28.0-28.9, adult: Secondary | ICD-10-CM | POA: Diagnosis not present

## 2023-03-03 NOTE — Progress Notes (Signed)
MyChart Video Visit    Virtual Visit via Video Note   This format is felt to be most appropriate for this patient at this time. Physical exam was limited by quality of the video and audio technology used for the visit.   Patient location: Patient's home address   Provider location: [facility]  [address], [address]  [place], [place] [address]   I discussed the limitations of evaluation and management by telemedicine and the availability of in person appointments. The patient expressed understanding and agreed to proceed.  Patient: Amy Barrett   DOB: 10-08-1990   32 y.o. Female  MRN: [id number] Visit Date: 03/03/2023  Today's healthcare provider: Ronnald Ramp, MD   No chief complaint on file.  Subjective    HPI   Discussed the use of AI scribe software for clinical note transcription with the patient, who gave verbal consent to proceed.  History of Present Illness   The patient, with a history of stress and weight gain, presents with concerns about her increasing weight. She reports a recent weight increase from 171 lbs to 180 lbs, which she attributes to stress and dietary habits, particularly a high intake of carbohydrates. She denies any other significant changes in health or lifestyle. The patient expresses dissatisfaction with her waist and stomach size, and a desire to prevent further weight gain. She denies any recent exercise routine, but notes frequent movement at work. The patient also mentions a recent cholesterol reading that was borderline high, which is of concern due to a family history of high cholesterol. She denies any symptoms of diabetes, and recent A1c and thyroid levels were reported as normal.          Past Medical History:  Diagnosis Date   Allergy    BV (bacterial vaginosis)    Dysmenorrhea since adolescence   [place] in 2011 that did not show any acute findings. Seen by Dr. Vickey Sages   Scoliosis    Yeast vaginitis      Medications: Outpatient Medications Prior to Visit  Medication Sig   cyclobenzaprine (FLEXERIL) 10 MG tablet Take 1 tablet (10 mg total) by mouth 3 (three) times daily as needed for muscle spasms.   DULoxetine (CYMBALTA) 60 MG capsule Take 1 capsule by mouth daily.   hydrOXYzine (ATARAX) 50 MG tablet Take 1 tablet (50 mg total) by mouth 3 (three) times daily as needed for anxiety.   ibuprofen (ADVIL) 800 MG tablet Take 1 tablet by mouth every 8 hours with food or milk as needed   levocetirizine (XYZAL) 5 MG tablet Take 1 tablet (5 mg total) by mouth at bedtime.   No facility-administered medications prior to visit.    Review of Systems  Last metabolic panel Lab Results  Component Value Date   GLUCOSE 84 12/02/2022   NA 137 12/02/2022   K 4.4 12/02/2022   CL 101 12/02/2022   CO2 22 12/02/2022   BUN 13 12/02/2022   CREATININE 0.86 12/02/2022   EGFR 91 12/02/2022   CALCIUM 9.2 12/02/2022   PROT 7.2 12/02/2022   ALBUMIN 4.5 12/02/2022   LABGLOB 2.7 12/02/2022   AGRATIO 2.1 12/03/2020   BILITOT 0.2 12/02/2022   ALKPHOS 76 12/02/2022   AST 24 12/02/2022   ALT 13 12/02/2022   ANIONGAP 11 09/21/2021   Last lipids Lab Results  Component Value Date   CHOL 167 12/02/2022   HDL 59 12/02/2022   LDLCALC 99 12/02/2022   TRIG 43 12/02/2022  CHOLHDL 2.8 12/02/2022    Last hemoglobin A1c Lab Results  Component Value Date   HGBA1C 5.0 12/02/2022   Last thyroid functions Lab Results  Component Value Date   TSH 2.680 12/02/2022        Objective    Ht 5\' 7"  (1.702 m)   Wt 181 lb (82.1 kg)   BMI 28.35 kg/m   BP Readings from Last 3 Encounters:  12/02/22 110/70  04/15/22 111/73  03/19/22 121/81   Wt Readings from Last 3 Encounters:  03/03/23 181 lb (82.1 kg)  12/02/22 171 lb 14.4 oz (78 kg)  04/15/22 166 lb 3.2 oz (75.4 kg)        Physical Exam Constitutional:      General: She is not in acute distress.    Appearance: Normal appearance. She is not  ill-appearing or toxic-appearing.  Pulmonary:     Effort: Pulmonary effort is normal. No respiratory distress.  Neurological:     Mental Status: She is alert.        Assessment & Plan     Problem List Items Addressed This Visit       Other   Overweight with body mass index (BMI) of 26 to 26.9 in adult - Primary    Gradual weight increase to 180 lbs with a BMI of 28.35, placing the patient in the overweight category. The patient expressed concern about further weight gain and requested a nutrition consult. -Refer to nutritionist for dietary counseling and calorie intake education. -Encourage regular exercise, including 30 minutes of walking most days of the week and weight training for toning and fat burning.      Relevant Orders   Amb ref to Medical Nutrition Therapy-MNT       Borderline High LDL Cholesterol LDL cholesterol at 99, which is borderline high. The patient has a family history of high cholesterol. -Encourage a diet rich in lean proteins and vegetables. -Advise regular exercise to help lower cholesterol levels.  Follow-up Patient to send [facility] recertification letter for this month.        No follow-ups on file.     I discussed the assessment and treatment plan with the patient. The patient was provided an opportunity to ask questions and all were answered. The patient agreed with the plan and demonstrated an understanding of the instructions.   The patient was advised to call back or seek an in-person evaluation if the symptoms worsen or if the condition fails to improve as anticipated.  I provided 15 minutes of non-face-to-face time during this encounter.   Ronnald Ramp, MD [facility] [phone] (phone) [phone] (fax)  [facility]

## 2023-03-03 NOTE — Assessment & Plan Note (Signed)
Gradual weight increase to 180 lbs with a BMI of 28.35, placing the patient in the overweight category. The patient expressed concern about further weight gain and requested a nutrition consult. -Refer to nutritionist for dietary counseling and calorie intake education. -Encourage regular exercise, including 30 minutes of walking most days of the week and weight training for toning and fat burning.

## 2023-03-04 ENCOUNTER — Encounter: Payer: Self-pay | Admitting: Family Medicine

## 2023-03-10 ENCOUNTER — Other Ambulatory Visit (HOSPITAL_COMMUNITY): Payer: Self-pay

## 2023-03-10 DIAGNOSIS — F4310 Post-traumatic stress disorder, unspecified: Secondary | ICD-10-CM | POA: Diagnosis not present

## 2023-03-10 DIAGNOSIS — F603 Borderline personality disorder: Secondary | ICD-10-CM | POA: Diagnosis not present

## 2023-03-15 ENCOUNTER — Telehealth: Payer: Self-pay | Admitting: Family Medicine

## 2023-03-15 NOTE — Telephone Encounter (Signed)
Sending back FMLA papers to be completed.

## 2023-03-17 ENCOUNTER — Telehealth: Payer: Self-pay | Admitting: Family Medicine

## 2023-03-17 DIAGNOSIS — F603 Borderline personality disorder: Secondary | ICD-10-CM | POA: Diagnosis not present

## 2023-03-17 DIAGNOSIS — F4310 Post-traumatic stress disorder, unspecified: Secondary | ICD-10-CM | POA: Diagnosis not present

## 2023-03-17 NOTE — Telephone Encounter (Signed)
LVM informing patient that her FMLA paperwork was completed & faxed. A copy will be left up front for pick up on [address].

## 2023-03-22 DIAGNOSIS — F4310 Post-traumatic stress disorder, unspecified: Secondary | ICD-10-CM | POA: Diagnosis not present

## 2023-03-22 DIAGNOSIS — F603 Borderline personality disorder: Secondary | ICD-10-CM | POA: Diagnosis not present

## 2023-03-29 DIAGNOSIS — F4310 Post-traumatic stress disorder, unspecified: Secondary | ICD-10-CM | POA: Diagnosis not present

## 2023-03-29 DIAGNOSIS — F603 Borderline personality disorder: Secondary | ICD-10-CM | POA: Diagnosis not present

## 2023-04-13 ENCOUNTER — Other Ambulatory Visit: Payer: Self-pay

## 2023-04-13 ENCOUNTER — Other Ambulatory Visit (HOSPITAL_COMMUNITY): Payer: Self-pay

## 2023-04-13 MED ORDER — IBUPROFEN 800 MG PO TABS
800.0000 mg | ORAL_TABLET | Freq: Three times a day (TID) | ORAL | 0 refills | Status: DC | PRN
  Filled 2023-04-13: qty 30, 10d supply, fill #0

## 2023-04-16 ENCOUNTER — Other Ambulatory Visit (HOSPITAL_COMMUNITY): Payer: Self-pay

## 2023-04-19 DIAGNOSIS — F603 Borderline personality disorder: Secondary | ICD-10-CM | POA: Diagnosis not present

## 2023-04-19 DIAGNOSIS — F4310 Post-traumatic stress disorder, unspecified: Secondary | ICD-10-CM | POA: Diagnosis not present

## 2023-05-17 DIAGNOSIS — F4310 Post-traumatic stress disorder, unspecified: Secondary | ICD-10-CM | POA: Diagnosis not present

## 2023-05-17 DIAGNOSIS — F603 Borderline personality disorder: Secondary | ICD-10-CM | POA: Diagnosis not present

## 2023-06-02 DIAGNOSIS — Z0289 Encounter for other administrative examinations: Secondary | ICD-10-CM

## 2023-06-06 ENCOUNTER — Ambulatory Visit (INDEPENDENT_AMBULATORY_CARE_PROVIDER_SITE_OTHER): Payer: 59 | Admitting: Family Medicine

## 2023-06-06 ENCOUNTER — Encounter (INDEPENDENT_AMBULATORY_CARE_PROVIDER_SITE_OTHER): Payer: Self-pay | Admitting: Family Medicine

## 2023-06-06 VITALS — BP 119/74 | HR 70 | Temp 98.2°F | Ht 67.0 in | Wt 192.0 lb

## 2023-06-06 DIAGNOSIS — E669 Obesity, unspecified: Secondary | ICD-10-CM | POA: Insufficient documentation

## 2023-06-06 DIAGNOSIS — Z6830 Body mass index (BMI) 30.0-30.9, adult: Secondary | ICD-10-CM | POA: Insufficient documentation

## 2023-06-06 DIAGNOSIS — E559 Vitamin D deficiency, unspecified: Secondary | ICD-10-CM

## 2023-06-06 NOTE — Progress Notes (Signed)
.smr  Office: [phone]  /  Fax: [phone]  WEIGHT SUMMARY AND BIOMETRICS  Anthropometric Measurements Height: 5\' 7"  (1.702 m) Weight: 192 lb (87.1 kg) BMI (Calculated): 30.06 Peak Weight: 192 lb   Body Composition  Body Fat %: 38.6 % Fat Mass (lbs): 74.2 lbs Muscle Mass (lbs): 112.2 lbs Total Body Water (lbs): 75.8 lbs Visceral Fat Rating : 7   Other Clinical Data Fasting: no Labs: no Comments: info session    Chief Complaint: OBESITY   History of Present Illness   The patient presents with a new diagnosis of obesity. She was referred by her primary care doctor for evaluation of her obesity.  She has a current weight of 192 pounds and a height of 5'7", resulting in a BMI of 30.06. Her visceral fat rating is seven, and her body fat percentage is 38.6%.  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 She engages in comfort eating, particularly during stressful times, consuming soda and 'a good meal' to feel better.  She is currently in nursing school, which she identifies as a significant source of stress contributing to her weight gain.          PHYSICAL EXAM:  Blood pressure 119/74, pulse 70, temperature 98.2 F (36.8 C), height 5\' 7"  (1.702 m), weight 192 lb (87.1 kg), last menstrual period 05/14/2023, SpO2 100%. Body mass index is 30.07 kg/m.  DIAGNOSTIC DATA REVIEWED:  BMET    Component Value Date/Time   NA 137 12/02/2022 0908   K 4.4 12/02/2022 0908   CL 101 12/02/2022 0908   CO2 22 12/02/2022 0908   GLUCOSE 84 12/02/2022 0908   GLUCOSE 95 09/21/2021 0731   BUN 13 12/02/2022 0908   CREATININE 0.86 12/02/2022 0908   CREATININE 0.70 08/08/2014 1610   CALCIUM 9.2 12/02/2022 0908   GFRNONAA >60 09/21/2021 0731   Lab Results  Component Value Date   HGBA1C 5.0 12/02/2022   No results found for: "INSULIN" Lab Results  Component Value Date   TSH 2.680  12/02/2022   CBC    Component Value Date/Time   WBC 9.0 12/02/2022 0908   WBC 7.8 09/21/2021 0731   RBC 3.90 12/02/2022 0908   RBC 3.88 09/21/2021 0731   HGB 11.3 12/02/2022 0908   HCT 34.2 12/02/2022 0908   PLT 245 12/02/2022 0908   MCV 88 12/02/2022 0908   MCH 29.0 12/02/2022 0908   MCH 29.4 09/21/2021 0731   MCHC 33.0 12/02/2022 0908   MCHC 32.6 09/21/2021 0731   RDW 11.9 12/02/2022 0908   Iron Studies    Component Value Date/Time   FERRITIN 22 10/06/2021 1115   Lipid Panel     Component Value Date/Time   CHOL 167 12/02/2022 0908   TRIG 43 12/02/2022 0908   HDL 59 12/02/2022 0908   CHOLHDL 2.8 12/02/2022 0908   CHOLHDL 3.9 12/12/2018 1118   VLDL 7 08/08/2014 1610   LDLCALC 99 12/02/2022 0908   LDLCALC 97 12/12/2018 1118   Hepatic Function Panel     Component Value Date/Time   PROT 7.2 12/02/2022 0908   ALBUMIN 4.5 12/02/2022 0908   AST 24 12/02/2022 0908   ALT 13 12/02/2022 0908   ALKPHOS 76 12/02/2022 0908   BILITOT 0.2 12/02/2022 0908      Component Value Date/Time   TSH 2.680 12/02/2022 0908   Nutritional Lab Results  Component Value Date   VD25OH <10 ng/mL (  L) 08/27/2009     Assessment and Plan    Obesity New diagnosis of obesity with a BMI of 30.06. Weight is 192 lbs and height is 5'7". Visceral fat rating is seven, and body fat percentage is 38.6%. Attributes weight gain to stress from nursing school, long study hours, and comfort eating. Discussed genetic and physiological mechanisms contributing to weight gain and body's resistance to weight loss. Emphasized personalized approach to weight management, including nutritional counseling and potential use of medications if necessary. Explained that eating plan is 90% of weight loss success, and medications address specific issues like excessive hunger or comfort eating. Discussed the importance of a comprehensive workup to identify mechanisms contributing to weight gain and the need for regular  follow-up to monitor progress and make necessary adjustments. - Schedule a comprehensive workup visit to identify mechanisms contributing to weight gain. - Provide new patient paperwork to be filled out at home, covering health, routine, activity level, and eating habits. - Schedule follow-up visits every two weeks for the first three months to monitor progress and make necessary adjustments. - Discuss potential use of weight loss medications if needed, based on the results of the workup and progress.   Vitamin D deficiency history of low vitamin D -plan to check labs at testing appointment as vit D deficiency can increase fatigue and make exercise difficult as well as increase risk of osteoporosis.  General Health Maintenance Discussed the importance of maintaining general health and the role of primary care in conjunction with obesity management. Explained that the clinic bills as primary care to reduce copay costs and can address general health questions as needed. - Ensure the primary care provider receives a copy of all assessments and plans. - Encourage the use of MyChart for easy communication and follow-up.  Follow-up - Schedule the next visit for a comprehensive workup, requiring arrival one hour early for testing. - Plan subsequent follow-up visits every two weeks for the first three months, then potentially extend to every three to four weeks based on progress.         I have personally spent 45 minutes total time today in preparation, patient care, and documentation for this visit, including the following: review of clinical lab tests; review of medical tests/procedures/services.    She was informed of the importance of frequent follow up visits to maximize her success with intensive lifestyle modifications for her multiple health conditions.    Quillian Quince, MD

## 2023-06-20 ENCOUNTER — Other Ambulatory Visit (HOSPITAL_COMMUNITY): Payer: Self-pay

## 2023-06-30 ENCOUNTER — Encounter: Payer: Self-pay | Admitting: Family Medicine

## 2023-07-04 ENCOUNTER — Ambulatory Visit (INDEPENDENT_AMBULATORY_CARE_PROVIDER_SITE_OTHER): Payer: 59 | Admitting: Family Medicine

## 2023-07-04 ENCOUNTER — Encounter (INDEPENDENT_AMBULATORY_CARE_PROVIDER_SITE_OTHER): Payer: Self-pay | Admitting: Family Medicine

## 2023-07-04 VITALS — BP 120/81 | HR 71 | Temp 98.3°F | Ht 67.0 in | Wt 191.0 lb

## 2023-07-04 DIAGNOSIS — R5383 Other fatigue: Secondary | ICD-10-CM | POA: Diagnosis not present

## 2023-07-04 DIAGNOSIS — M419 Scoliosis, unspecified: Secondary | ICD-10-CM

## 2023-07-04 DIAGNOSIS — Z1331 Encounter for screening for depression: Secondary | ICD-10-CM

## 2023-07-04 DIAGNOSIS — E669 Obesity, unspecified: Secondary | ICD-10-CM

## 2023-07-04 DIAGNOSIS — Z6829 Body mass index (BMI) 29.0-29.9, adult: Secondary | ICD-10-CM | POA: Diagnosis not present

## 2023-07-04 DIAGNOSIS — E559 Vitamin D deficiency, unspecified: Secondary | ICD-10-CM | POA: Diagnosis not present

## 2023-07-04 DIAGNOSIS — R0602 Shortness of breath: Secondary | ICD-10-CM

## 2023-07-04 NOTE — Progress Notes (Signed)
 Chief Complaint:  Obesity   Subjective:  Amy Barrett (MR# [id number]) is a 33 y.o. female who presents for evaluation and treatment of obesity and related comorbidities.   Amy Barrett is currently in the action stage of change and ready to dedicate time achieving and maintaining a healthier weight. Amy Barrett is interested in becoming our patient and working on intensive lifestyle modifications including (but not limited to) diet and exercise for weight loss.  Amy Barrett has been struggling with her weight. She has been unsuccessful in either losing weight, maintaining weight loss, or reaching her healthy weight goal.  Patient is not currently sure whether or not she would like a baby. 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  She lives alone- family is supportive of her but their cooking isn't always the most supportive. Says she started gaining weight in the last year; weight baseline was around 160 and she gained around 30lbs.  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   Eats out fast food or take out more than twice a week.  Realizing that she is doing quite a bit of emotional eating after her grandmother died 06/20/23.  Patient reports eating outside the home fast food or takeout more than twice a week.  She does a majority of her grocery shopping biweekly with a grocery list.  She likes to cook baked foods but her biggest obstacle to cooking is time.  She tends to crave carbohydrates.  She does consider herself a picky eater.  She often skips breakfast.  Food Recall: 10am may have a small bag of chips and a dr. Reino Kent or pepsi.  She may not eat anything until 1-2pm would grab something fast food or leftovers.  May go to [facility] for 4 cheeseburger, fries, chicken nuggets and a sweet tea- feels satisfied after eating all.  Will eat something later like around 6pm something at home in the air fryer- baked foods.  Baked chicken and will eat 2 pieces  of chicken, starch (1/2 cup), cabbage (1/2 cup), baked beans (1/2 cup) and soda.  Not eating after that meal.    Indirect Calorimeter completed today shows a No data recorded .Her calculated basal metabolic rate is 2130 thus her basal metabolic rate is worse than expected.  Other Fatigue Amy Barrett admits to daytime somnolence and admits to waking up still tired. Patient has a history of symptoms of daytime fatigue. Amy Barrett generally gets 8 hours of sleep per night, and states that she has nightime awakenings. Snoring is present. Apneic episodes are not present. Epworth Sleepiness Score is 12.   Shortness of Breath Amy Barrett notes increasing shortness of breath with exercising and seems to be worsening over time with weight gain. She notes getting out of breath sooner with activity than she used to. This has not gotten worse recently. Amy Barrett denies shortness of breath at rest or orthopnea.  Depression Screen Amy Barrett's Food and Mood (modified PHQ-9) score was 5.     07/04/2023    8:46 AM  Depression screen PHQ 2/9  Decreased Interest 0  Down, Depressed, Hopeless 0  PHQ - 2 Score 0  Altered sleeping 1  Tired, decreased energy 1  Change in appetite 0  Feeling bad or failure about yourself  0  Trouble concentrating 2  Moving slowly or fidgety/restless 1  Suicidal thoughts 0  PHQ-9 Score 5     Objective:  No data recorded     07/04/2023  8:00 AM 06/06/2023   10:00 AM 03/03/2023    2:51 PM  Vitals with BMI  Height 5\' 7"  5\' 7"  5\' 7"   Weight 191 lbs 192 lbs 181 lbs  BMI 29.91 30.06 28.34  Systolic 120 119   Diastolic 81 74   Pulse 71 70        EKG: Normal sinus rhythm, rate 64.  General: Cooperative, alert, well developed, in no acute distress. HEENT: Conjunctivae and lids unremarkable. Cardiovascular: Regular rhythm.  Lungs: Normal work of breathing. Neurologic: No focal deficits.   Lab Results  Component Value Date   CREATININE 0.73 07/04/2023   BUN 13 07/04/2023   NA  137 07/04/2023   K 4.4 07/04/2023   CL 103 07/04/2023   CO2 20 07/04/2023   Lab Results  Component Value Date   ALT 12 07/04/2023   AST 21 07/04/2023   ALKPHOS 98 07/04/2023   BILITOT 0.3 07/04/2023   Lab Results  Component Value Date   HGBA1C 5.0 07/04/2023   HGBA1C 5.0 12/02/2022   Lab Results  Component Value Date   INSULIN 8.2 07/04/2023   Lab Results  Component Value Date   TSH 0.616 07/04/2023   Lab Results  Component Value Date   CHOL 162 07/04/2023   HDL 47 07/04/2023   LDLCALC 106 (H) 07/04/2023   TRIG 43 07/04/2023   CHOLHDL 2.8 12/02/2022   Lab Results  Component Value Date   WBC 8.4 07/04/2023   HGB 12.2 07/04/2023   HCT 37.2 07/04/2023   MCV 88 07/04/2023   PLT 290 07/04/2023   Lab Results  Component Value Date   FERRITIN 22 10/06/2021    Assessment and Plan:   Other Fatigue  Amy Barrett does feel that her weight is causing her energy to be lower than it should be. Fatigue may be related to obesity, depression or many other causes. Labs will be ordered, and in the meanwhile, Amy Barrett will focus on self care including making healthy food choices, increasing physical activity and focusing on stress reduction.  Shortness of Breath  Amy Barrett does feel that she gets out of breath more easily that she used to when she exercises. 's shortness of breath appears to be obesity related and exercise induced. She has agreed to work on weight loss and gradually increase exercise to treat her exercise induced shortness of breath. Will continue to monitor closely.   Problem List Items Addressed This Visit       Musculoskeletal and Integument   Scoliosis   Diagnosed as an adult- takes cymbalta for pain.  Has not ever had to brace.  Follow up on symptoms when patient starts her physical activity regimen in 2-3 months.        Other   Obesity, Beginning BMI 30.07   Anthropometric Measurements Height: 5\' 7"  (1.702 m) Weight: 191 lb (86.6 kg) BMI (Calculated):  29.91 Starting Weight: 191 lb Peak Weight: 192 lb Waist Measurement : 42 inches Body Composition  Body Fat %: 38.3 % Fat Mass (lbs): 73.2 lbs Muscle Mass (lbs): 111.8 lbs Total Body Water (lbs): 74.6 lbs Visceral Fat Rating : 7 Other Clinical Data RMR: [id number] Fasting: yes Labs: yes Today's Visit #: 1 Starting Date: 07/04/23       Vitamin D deficiency   Diagnosis likely given obesity.  Will draw vitamin d level today.  She reports fatigue.  Anticipate discussing lab result at next appointment.      Relevant Orders   VITAMIN D 25 Hydroxy (Vit-D  Deficiency, Fractures) (Completed)   Other Visit Diagnoses       Other fatigue    -  Primary   Relevant Orders   EKG 12-Lead   Vitamin B12 (Completed)   T4, free (Completed)   T3 (Completed)   CBC with Differential/Platelet (Completed)   Comprehensive metabolic panel (Completed)   Folate (Completed)   Hemoglobin A1c (Completed)   Insulin, random (Completed)   Lipid Panel With LDL/HDL Ratio (Completed)   TSH (Completed)     SOBOE (shortness of breath on exertion)         Depression screening           Amy Barrett is currently in the action stage of change and her goal is to continue with weight loss efforts. I recommend Amy Barrett begin the structured treatment plan as follows:  She has agreed to Category 2 Plan with an additional 100 calories  Exercise goals: No exercise has been prescribed at this time.  Behavioral modification strategies:increasing lean protein intake, increasing vegetables, meal planning and cooking strategies, and avoiding temptations  She was informed of the importance of frequent follow-up visits to maximize her success with intensive lifestyle modifications for her multiple health conditions. She was informed we would discuss her lab results at her next visit unless there is a critical issue that needs to be addressed sooner. Amy Barrett agreed to keep her next visit at the agreed upon time to discuss these  results.  Labs ordered with plans to discuss at the next visit.   Attestation Statements:  Reviewed by clinician on day of visit: allergies, medications, problem list, medical history, surgical history, family history, social history, and previous encounter notes. This is the patient's first visit at [facility]. The patient's NEW PATIENT PACKET was reviewed at length. Included in the packet: current and past health history, medications, allergies, ROS, gynecologic history (women only), surgical history, family history, social history, weight history, weight loss surgery history (for those that have had weight loss surgery), nutritional evaluation, mood and food questionnaire, PHQ9, Epworth questionnaire, sleep habits questionnaire, patient life and health improvement goals questionnaire. These will all be scanned into the patient's chart under media.   During the visit, I independently reviewed the patient's EKG, bioimpedance scale results, and indirect calorimeter results. I used this information to tailor a meal plan for the patient that will help her to lose weight and will improve her obesity-related conditions going forward. I performed a medically necessary appropriate examination and/or evaluation. I discussed the assessment and treatment plan with the patient. The patient was provided an opportunity to ask questions and all were answered. The patient agreed with the plan and demonstrated an understanding of the instructions. Labs were ordered at this visit and will be reviewed at the next visit unless more critical results need to be addressed immediately. Clinical information was updated and documented in the EMR.     Time spent on visit including pre-visit chart review and post-visit charting and care was 35 minutes.   Reuben Likes, MD

## 2023-07-04 NOTE — Assessment & Plan Note (Signed)
 Diagnosis likely given obesity.  Will draw vitamin d level today.  She reports fatigue.  Anticipate discussing lab result at next appointment.

## 2023-07-04 NOTE — Assessment & Plan Note (Signed)
 Diagnosed as an adult- takes cymbalta for pain.  Has not ever had to brace.  Follow up on symptoms when patient starts her physical activity regimen in 2-3 months.

## 2023-07-05 LAB — COMPREHENSIVE METABOLIC PANEL
ALT: 12 IU/L (ref 0–32)
AST: 21 IU/L (ref 0–40)
Albumin: 4.5 g/dL (ref 3.9–4.9)
Alkaline Phosphatase: 98 IU/L (ref 44–121)
BUN/Creatinine Ratio: 18 (ref 9–23)
BUN: 13 mg/dL (ref 6–20)
Bilirubin Total: 0.3 mg/dL (ref 0.0–1.2)
CO2: 20 mmol/L (ref 20–29)
Calcium: 9.2 mg/dL (ref 8.7–10.2)
Chloride: 103 mmol/L (ref 96–106)
Creatinine, Ser: 0.73 mg/dL (ref 0.57–1.00)
Globulin, Total: 3 g/dL (ref 1.5–4.5)
Glucose: 89 mg/dL (ref 70–99)
Potassium: 4.4 mmol/L (ref 3.5–5.2)
Sodium: 137 mmol/L (ref 134–144)
Total Protein: 7.5 g/dL (ref 6.0–8.5)
eGFR: 110 mL/min/{1.73_m2} (ref >59)

## 2023-07-05 LAB — CBC WITH DIFFERENTIAL/PLATELET
Basophils Absolute: 0 10*3/uL (ref 0.0–0.2)
Basos: 1 %
EOS (ABSOLUTE): 0.2 10*3/uL (ref 0.0–0.4)
Eos: 2 %
Hematocrit: 37.2 % (ref 34.0–46.6)
Hemoglobin: 12.2 g/dL (ref 11.1–15.9)
Immature Grans (Abs): 0 10*3/uL (ref 0.0–0.1)
Immature Granulocytes: 0 %
Lymphocytes Absolute: 1.9 10*3/uL (ref 0.7–3.1)
Lymphs: 23 %
MCH: 28.9 pg (ref 26.6–33.0)
MCHC: 32.8 g/dL (ref 31.5–35.7)
MCV: 88 fL (ref 79–97)
Monocytes Absolute: 0.7 10*3/uL (ref 0.1–0.9)
Monocytes: 8 %
Neutrophils Absolute: 5.7 10*3/uL (ref 1.4–7.0)
Neutrophils: 66 %
Platelets: 290 10*3/uL (ref 150–450)
RBC: 4.22 x10E6/uL (ref 3.77–5.28)
RDW: 11.9 % (ref 11.7–15.4)
WBC: 8.4 10*3/uL (ref 3.4–10.8)

## 2023-07-05 LAB — HEMOGLOBIN A1C
Est. average glucose Bld gHb Est-mCnc: 97 mg/dL
Hgb A1c MFr Bld: 5 % (ref 4.8–5.6)

## 2023-07-05 LAB — LIPID PANEL WITH LDL/HDL RATIO
Cholesterol, Total: 162 mg/dL (ref 100–199)
HDL: 47 mg/dL (ref >39)
LDL Chol Calc (NIH): 106 mg/dL — ABNORMAL HIGH (ref 0–99)
LDL/HDL Ratio: 2.3 ratio (ref 0.0–3.2)
Triglycerides: 43 mg/dL (ref 0–149)
VLDL Cholesterol Cal: 9 mg/dL (ref 5–40)

## 2023-07-05 LAB — T3: T3, Total: 116 ng/dL (ref 71–180)

## 2023-07-05 LAB — INSULIN, RANDOM: INSULIN: 8.2 u[IU]/mL (ref 2.6–24.9)

## 2023-07-05 LAB — FOLATE: Folate: 2.4 ng/mL — ABNORMAL LOW (ref >3.0)

## 2023-07-05 LAB — VITAMIN D 25 HYDROXY (VIT D DEFICIENCY, FRACTURES): Vit D, 25-Hydroxy: 9.3 ng/mL — ABNORMAL LOW (ref 30.0–100.0)

## 2023-07-05 LAB — VITAMIN B12: Vitamin B-12: 338 pg/mL (ref 232–1245)

## 2023-07-05 LAB — TSH: TSH: 0.616 u[IU]/mL (ref 0.450–4.500)

## 2023-07-05 LAB — T4, FREE: Free T4: 1.07 ng/dL (ref 0.82–1.77)

## 2023-07-05 NOTE — Telephone Encounter (Signed)
**Note De-identified  [place] Obfuscation** Please advise 

## 2023-07-18 ENCOUNTER — Other Ambulatory Visit (HOSPITAL_COMMUNITY): Payer: Self-pay

## 2023-07-18 ENCOUNTER — Ambulatory Visit (INDEPENDENT_AMBULATORY_CARE_PROVIDER_SITE_OTHER): Payer: 59 | Admitting: Family Medicine

## 2023-07-18 VITALS — BP 126/77 | HR 93 | Temp 98.4°F | Ht 67.0 in | Wt 190.0 lb

## 2023-07-18 DIAGNOSIS — E559 Vitamin D deficiency, unspecified: Secondary | ICD-10-CM

## 2023-07-18 DIAGNOSIS — Z6829 Body mass index (BMI) 29.0-29.9, adult: Secondary | ICD-10-CM

## 2023-07-18 DIAGNOSIS — E669 Obesity, unspecified: Secondary | ICD-10-CM | POA: Diagnosis not present

## 2023-07-18 DIAGNOSIS — E538 Deficiency of other specified B group vitamins: Secondary | ICD-10-CM

## 2023-07-18 DIAGNOSIS — E785 Hyperlipidemia, unspecified: Secondary | ICD-10-CM | POA: Insufficient documentation

## 2023-07-18 DIAGNOSIS — E7800 Pure hypercholesterolemia, unspecified: Secondary | ICD-10-CM

## 2023-07-18 DIAGNOSIS — Z6830 Body mass index (BMI) 30.0-30.9, adult: Secondary | ICD-10-CM

## 2023-07-18 MED ORDER — VITAMIN D (ERGOCALCIFEROL) 1.25 MG (50000 UNIT) PO CAPS
50000.0000 [IU] | ORAL_CAPSULE | ORAL | 0 refills | Status: DC
  Filled 2023-07-18: qty 12, 84d supply, fill #0

## 2023-07-18 NOTE — Assessment & Plan Note (Signed)
 Last LDL 106.  HDL slightly low (less than 50- previously higher than 50).  Patient to work on limiting saturated fats staying consistent with her physical activity.

## 2023-07-18 NOTE — Progress Notes (Unsigned)
   SUBJECTIVE:  Chief Complaint: Obesity  Interim History: Patient has been trying to implement portion control with monitoring fats, grease, carbs.  She isn't a sandwich person so lunch was a struggle.  She mentions she found herself hungry consistently even being mindful of food choices.  She is wondering about portion control plates to see if that helps visualize how much she should be eating.   Maily is here to discuss her progress with her obesity treatment plan. She is on the Category 2 Plan and doing portion control and states she is following her eating plan approximately 25 % of the time. She states she is not exercising.   OBJECTIVE: Visit Diagnoses: Problem List Items Addressed This Visit       Other   Vitamin D deficiency - Primary   Discussed importance of vitamin d supplementation.  Vitamin d supplementation has been shown to decrease fatigue, decrease risk of progression to insulin resistance and then prediabetes, decreases risk of falling in older age and can even assist in decreasing depressive symptoms in PTSD.   Prescription for Vitamin D sent in.        Folate deficiency   Patient told to start a prenatal vitamin.        Hyperlipidemia   Last LDL 106.  HDL slightly low (less than 50- previously higher than 50).  Patient to work on limiting saturated fats staying consistent with her physical activity.       Vitals Temp: 98.4 F (36.9 C) BP: 126/77 Pulse Rate: 93 SpO2: 100 %   Anthropometric Measurements Height: 5\' 7"  (1.702 m) Weight: 190 lb (86.2 kg) BMI (Calculated): 29.75 Weight at Last Visit: 191 lb Weight Lost Since Last Visit: 1 Weight Gained Since Last Visit: 0 Starting Weight: 191 lb Total Weight Loss (lbs): 1 lb (0.454 kg) Peak Weight: 192 lb   Body Composition  Body Fat %: 37.3 % Fat Mass (lbs): 71 lbs Muscle Mass (lbs): 113.6 lbs Total Body Water (lbs): 75 lbs Visceral Fat Rating : 7   Other Clinical Data Today's Visit #:  2 Starting Date: 07/04/23 Comments: Cat 2     ASSESSMENT AND PLAN:  Diet: Omolara is currently in the action stage of change. As such, her goal is to continue with weight loss efforts and has agreed to the Category 2 Plan with options for breakfast and lunch.   Exercise:  All adults should avoid inactivity. Some activity is better than none, and adults who participate in any amount of physical activity, gain some health benefits.  Behavior Modification:  We discussed the following Behavioral Modification Strategies today: increasing lean protein intake, increasing vegetables, meal planning and cooking strategies, better snacking choices, avoiding temptations, and planning for success. We discussed various medication options to help Wyoma with her weight loss efforts and we both agreed to ***.  No follow-ups on file.Marland Kitchen She was informed of the importance of frequent follow up visits to maximize her success with intensive lifestyle modifications for her multiple health conditions.  Attestation Statements:   Reviewed by clinician on day of visit: allergies, medications, problem list, medical history, surgical history, family history, social history, and previous encounter notes.   Time spent on visit including pre-visit chart review and post-visit care and charting was 30 minutes  Reuben Likes, MD

## 2023-07-18 NOTE — Assessment & Plan Note (Signed)
 Anthropometric Measurements Height: 5\' 7"  (1.702 m) Weight: 191 lb (86.6 kg) BMI (Calculated): 29.91 Starting Weight: 191 lb Peak Weight: 192 lb Waist Measurement : 42 inches Body Composition  Body Fat %: 38.3 % Fat Mass (lbs): 73.2 lbs Muscle Mass (lbs): 111.8 lbs Total Body Water (lbs): 74.6 lbs Visceral Fat Rating : 7 Other Clinical Data RMR: 1627 Fasting: yes Labs: yes Today's Visit #: 1 Starting Date: 07/04/23

## 2023-07-18 NOTE — Assessment & Plan Note (Signed)
 Starting weight: 191 Peak weight: 192 BMR: 1627 Previous obesity management:  Body Fat %: 37.3% Starting Meal Plan: Cat 2 Meal Plan needs: easy options Likes/ Dislikes of meal plan: doesn't like sandwiches likes other options for lunch, looking for other options for breakfast.

## 2023-07-18 NOTE — Assessment & Plan Note (Signed)
 Discussed importance of vitamin d supplementation.  Vitamin d supplementation has been shown to decrease fatigue, decrease risk of progression to insulin resistance and then prediabetes, decreases risk of falling in older age and can even assist in decreasing depressive symptoms in PTSD.   Prescription for Vitamin D sent in.

## 2023-07-18 NOTE — Assessment & Plan Note (Signed)
 Patient told to start a prenatal vitamin.

## 2023-07-22 DIAGNOSIS — Z0279 Encounter for issue of other medical certificate: Secondary | ICD-10-CM

## 2023-07-22 NOTE — Assessment & Plan Note (Signed)
 Anthropometric Measurements Height: 5\' 7"  (1.702 m) Weight: 190 lb (86.2 kg) BMI (Calculated): 29.75 Weight at Last Visit: 191 lb Weight Lost Since Last Visit: 1 Weight Gained Since Last Visit: 0 Starting Weight: 191 lb Total Weight Loss (lbs): 1 lb (0.454 kg) Peak Weight: 192 lb Body Composition  Body Fat %: 37.3 % Fat Mass (lbs): 71 lbs Muscle Mass (lbs): 113.6 lbs Total Body Water (lbs): 75 lbs Visceral Fat Rating : 7 Other Clinical Data Today's Visit #: 2 Starting Date: 07/04/23 Comments: Cat 2

## 2023-08-01 ENCOUNTER — Ambulatory Visit (INDEPENDENT_AMBULATORY_CARE_PROVIDER_SITE_OTHER): Admitting: Family Medicine

## 2023-08-02 ENCOUNTER — Encounter: Payer: Self-pay | Admitting: Family Medicine

## 2023-08-09 DIAGNOSIS — F4310 Post-traumatic stress disorder, unspecified: Secondary | ICD-10-CM | POA: Diagnosis not present

## 2023-08-09 DIAGNOSIS — F603 Borderline personality disorder: Secondary | ICD-10-CM | POA: Diagnosis not present

## 2023-08-11 ENCOUNTER — Encounter: Payer: Self-pay | Admitting: Family Medicine

## 2023-08-14 NOTE — Progress Notes (Unsigned)
 SUBJECTIVE: Discussed the use of AI scribe software for clinical note transcription with the patient, who gave verbal consent to proceed.  Chief Complaint: Obesity  Interim History: She is up 1 lb since last visit.   Amy Barrett is here to discuss her progress with her obesity treatment plan. She is on the Category 2 Plan and states she is following her eating plan approximately 25 % of the time. She states she is not exercising  Amy Barrett is a 33 year old female who presents for follow-up of her obesity treatment plan.  She has experienced a weight change of one pound since her last visit. She is focusing on building muscle mass but notes an increase in adipose tissue as well.  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 She often skips breakfast or opts for snacks like chips and soda from vending machines. When she does eat breakfast, it typically consists of pancakes, bacon, and eggs. Her meals are often higher in carbohydrates and fats. She is interested in incorporating protein shakes into her diet and is considering options like Premier protein shakes and high-protein meals to help manage her hunger and improve her nutrition.  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 She often eats larger portions when she does eat due to hunger. Her meals may not be balanced, often higher in carbohydrates and fats.  She is grieving the recent loss of her grandmother, who passed away in 06/29/2023 at the age of ninety[age]nine. She was very close to her grandmother and took care of her, which has been emotionally challenging.  OBJECTIVE: Visit Diagnoses: Problem List Items Addressed This Visit     BMI 30.0-30.9,adult   Obesity, Beginning BMI 30.07   Vitamin D deficiency - Primary   Relevant Medications   Vitamin D, Ergocalciferol, (DRISDOL) 1.25 MG (50000  UNIT) CAPS capsule   Hyperlipidemia  Obesity Amy Barrett is managing obesity, with a one-pound weight gain since her last visit. 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 She struggles with meal planning, often resorting to high-calorie, high-fat options. Emphasized the importance of starting the day with a protein shake to sustain her until her next meal, and the need for portion control and choosing high-protein, low-fat, and low-sugar options to promote weight loss. Recommended Kevin's natural meat products and Barilla high protein pasta as convenient meal options. Highlighted the benefits of protein shakes like Premier or Core Power for maintaining satiety and reducing unhealthy snacking. - Start the day with a protein shake, such as Manufacturing engineer, to provide a good protein source. - Advise on portion control and choosing high-protein, low-fat, and low-sugar meal options. - Suggest trying Kevin's natural meat products and Barilla high protein pasta for convenient meal options. - Encourage mindful eating and avoiding high-calorie, high-fat breakfast options.   Vitamin D Deficiency Amy Barrett has vitamin D deficiency. On Ergocalciferol 50,000 units once weekly. No N/V or muscle weakness with Ergo. Last vitamin D Lab Results  Component Value Date   VD25OH 9.3 (L) 07/04/2023   Low vitamin D levels can be associated with adiposity and may result in leptin resistance and weight gain. Also associated with fatigue.  Currently on vitamin D supplementation without any adverse effects such as nausea, vomiting or muscle weakness.  -  Refill vitamin D prescription at [facility]. Meds ordered this encounter  Medications   Vitamin D, Ergocalciferol, (DRISDOL) 1.25 MG (50000 UNIT) CAPS capsule    Sig: Take 1 capsule (50,000 Units total) by mouth every 7 (seven) days.    Dispense:  12 capsule     Refill:  0   Hyperlipidemia LDL is not at goal. Medication(s): None Cardiovascular risk factors: dyslipidemia and obesity (BMI >= 30 kg/m2)  Lab Results  Component Value Date   CHOL 162 07/04/2023   HDL 47 07/04/2023   LDLCALC 106 (H) 07/04/2023   TRIG 43 07/04/2023   CHOLHDL 2.8 12/02/2022   CHOLHDL 3.9 12/12/2018   CHOLHDL 2.8 08/08/2014   Lab Results  Component Value Date   ALT 12 07/04/2023   AST 21 07/04/2023   ALKPHOS 98 07/04/2023   BILITOT 0.3 07/04/2023   The ASCVD Risk score (Arnett DK, et al., 2019) failed to calculate for the following reasons:   The 2019 ASCVD risk score is only valid for ages 14 to 81  Plan: Continue to work on Engineer, technical sales -decreasing simple carbohydrates, increasing lean proteins, decreasing saturated fats and cholesterol , avoiding trans fats and exercise as able to promote weight loss, improve lipids and decrease cardiovascular risks.     Vitals Temp: 98.2 F (36.8 C) BP: 111/73 Pulse Rate: 62 SpO2: 99 %   Anthropometric Measurements Height: 5\' 7"  (1.702 m) Weight: 191 lb (86.6 kg) BMI (Calculated): 29.91 Weight at Last Visit: 190 lb Weight Lost Since Last Visit: 0 Weight Gained Since Last Visit: 1 lb Starting Weight: 191 lb Total Weight Loss (lbs): 0 lb (0 kg) Peak Weight: 192 lb   Body Composition  Body Fat %: 37.9 % Fat Mass (lbs): 72.6 lbs Muscle Mass (lbs): 113.2 lbs Total Body Water (lbs): 76 lbs Visceral Fat Rating : 7   Other Clinical Data Fasting: no Labs: no Today's Visit #: 3 Starting Date: 07/04/23     ASSESSMENT AND PLAN:  Diet: Amy Barrett is currently in the action stage of change. As such, her goal is to continue with weight loss efforts. She has agreed to Category 2 Plan.  Exercise: [name] has been instructed to work up to a goal of 150 minutes of combined cardio and strengthening exercise per week and that some exercise is better than none for weight loss and overall health  benefits.   Behavior Modification:  We discussed the following Behavioral Modification Strategies today: increasing lean protein intake, decreasing simple carbohydrates, increasing vegetables, increase H2O intake, increase high fiber foods, no skipping meals, better snacking choices, avoiding temptations, and planning for success. We discussed various medication options to help Madalaine with her weight loss efforts and we both agreed to continue to work on nutritional and behavioral strategies to promote weight loss.  .  Return in about 4 weeks (around 09/12/2023).Aaron Aas She was informed of the importance of frequent follow up visits to maximize her success with intensive lifestyle modifications for her multiple health conditions.  Attestation Statements:   Reviewed by clinician on day of visit: allergies, medications, problem list, medical history, surgical history, family history, social history, and previous encounter notes.   Time spent on visit including pre-visit chart review and post-visit care and charting was 27 minutes.    Ahsley Attwood, PA-C

## 2023-08-15 ENCOUNTER — Ambulatory Visit (INDEPENDENT_AMBULATORY_CARE_PROVIDER_SITE_OTHER): Admitting: Physician Assistant

## 2023-08-15 ENCOUNTER — Encounter (INDEPENDENT_AMBULATORY_CARE_PROVIDER_SITE_OTHER): Payer: Self-pay | Admitting: Physician Assistant

## 2023-08-15 ENCOUNTER — Other Ambulatory Visit (HOSPITAL_COMMUNITY): Payer: Self-pay

## 2023-08-15 VITALS — BP 111/73 | HR 62 | Temp 98.2°F | Ht 67.0 in | Wt 191.0 lb

## 2023-08-15 DIAGNOSIS — Z6830 Body mass index (BMI) 30.0-30.9, adult: Secondary | ICD-10-CM | POA: Diagnosis not present

## 2023-08-15 DIAGNOSIS — E785 Hyperlipidemia, unspecified: Secondary | ICD-10-CM | POA: Diagnosis not present

## 2023-08-15 DIAGNOSIS — E669 Obesity, unspecified: Secondary | ICD-10-CM | POA: Diagnosis not present

## 2023-08-15 DIAGNOSIS — E559 Vitamin D deficiency, unspecified: Secondary | ICD-10-CM

## 2023-08-15 DIAGNOSIS — E7800 Pure hypercholesterolemia, unspecified: Secondary | ICD-10-CM

## 2023-08-15 MED ORDER — VITAMIN D (ERGOCALCIFEROL) 1.25 MG (50000 UNIT) PO CAPS
50000.0000 [IU] | ORAL_CAPSULE | ORAL | 0 refills | Status: AC
  Filled 2023-08-15 – 2023-10-03 (×3): qty 12, 84d supply, fill #0

## 2023-08-30 DIAGNOSIS — F603 Borderline personality disorder: Secondary | ICD-10-CM | POA: Diagnosis not present

## 2023-08-30 DIAGNOSIS — F4310 Post-traumatic stress disorder, unspecified: Secondary | ICD-10-CM | POA: Diagnosis not present

## 2023-09-12 ENCOUNTER — Other Ambulatory Visit (HOSPITAL_COMMUNITY): Payer: Self-pay

## 2023-09-12 DIAGNOSIS — B3731 Acute candidiasis of vulva and vagina: Secondary | ICD-10-CM | POA: Diagnosis not present

## 2023-09-12 DIAGNOSIS — Z113 Encounter for screening for infections with a predominantly sexual mode of transmission: Secondary | ICD-10-CM | POA: Diagnosis not present

## 2023-09-12 DIAGNOSIS — A749 Chlamydial infection, unspecified: Secondary | ICD-10-CM | POA: Diagnosis not present

## 2023-09-12 DIAGNOSIS — N898 Other specified noninflammatory disorders of vagina: Secondary | ICD-10-CM | POA: Diagnosis not present

## 2023-09-12 MED ORDER — FLUCONAZOLE 150 MG PO TABS
ORAL_TABLET | ORAL | 1 refills | Status: DC
  Filled 2023-09-12: qty 1, 1d supply, fill #0
  Filled 2023-09-20: qty 1, 1d supply, fill #1

## 2023-09-12 MED ORDER — DOXYCYCLINE HYCLATE 100 MG PO TABS
100.0000 mg | ORAL_TABLET | Freq: Two times a day (BID) | ORAL | 0 refills | Status: DC
  Filled 2023-09-12: qty 14, 7d supply, fill #0

## 2023-09-15 ENCOUNTER — Other Ambulatory Visit (HOSPITAL_COMMUNITY): Payer: Self-pay

## 2023-09-20 ENCOUNTER — Ambulatory Visit (INDEPENDENT_AMBULATORY_CARE_PROVIDER_SITE_OTHER): Admitting: Physician Assistant

## 2023-09-20 ENCOUNTER — Other Ambulatory Visit (HOSPITAL_COMMUNITY): Payer: Self-pay

## 2023-09-20 ENCOUNTER — Other Ambulatory Visit: Payer: Self-pay

## 2023-09-20 ENCOUNTER — Encounter (INDEPENDENT_AMBULATORY_CARE_PROVIDER_SITE_OTHER): Payer: Self-pay | Admitting: Physician Assistant

## 2023-09-20 VITALS — BP 125/82 | HR 66 | Temp 98.2°F | Ht 67.0 in | Wt 195.0 lb

## 2023-09-20 DIAGNOSIS — E669 Obesity, unspecified: Secondary | ICD-10-CM

## 2023-09-20 DIAGNOSIS — E559 Vitamin D deficiency, unspecified: Secondary | ICD-10-CM

## 2023-09-20 DIAGNOSIS — E785 Hyperlipidemia, unspecified: Secondary | ICD-10-CM

## 2023-09-20 DIAGNOSIS — Z6830 Body mass index (BMI) 30.0-30.9, adult: Secondary | ICD-10-CM | POA: Diagnosis not present

## 2023-09-20 DIAGNOSIS — E7800 Pure hypercholesterolemia, unspecified: Secondary | ICD-10-CM

## 2023-09-20 DIAGNOSIS — F4310 Post-traumatic stress disorder, unspecified: Secondary | ICD-10-CM | POA: Diagnosis not present

## 2023-09-20 DIAGNOSIS — F603 Borderline personality disorder: Secondary | ICD-10-CM | POA: Diagnosis not present

## 2023-09-20 MED ORDER — FLUCONAZOLE 150 MG PO TABS
ORAL_TABLET | ORAL | 0 refills | Status: DC
  Filled 2023-09-20: qty 1, 1d supply, fill #0

## 2023-09-20 NOTE — Progress Notes (Signed)
 SUBJECTIVE: Discussed the use of AI scribe software for clinical note transcription with the patient, who gave verbal consent to proceed.  Chief Complaint: Obesity  Interim History: She is up 4 lbs since her last visit.   Amy Barrett is here to discuss her progress with her obesity treatment plan. She is on the Category 2 Plan and states she is following her eating plan approximately 0 % of the time. She states she is not exercising 0 minutes 0 times per week. Amy Barrett is a 33 year old female who presents for follow-up of her obesity treatment plan.  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 Eating provides comfort, but her food choices are not always healthy. She is attempting to manage stress and eating habits while balancing clinicals and work schedule.  She faces difficulty in grocery shopping for healthier options due to financial constraints, as her work hours have been reduced. She often resorts to eating what her family cooks or quick meals like cereal. High protein cereal options are noted to be more expensive.  She is uncertain about proper exercises and has not been consistent with physical activity. Walking around [facility] is a past activity she enjoyed and lives close by, which could be a potential exercise option.  Her typical diet includes breakfast items like biscuits, bacon, eggs, and grits, but she sometimes skips breakfast. Lunch and dinner often consist of takeout or leftovers, such as hibachi or fried shrimp with fries and coleslaw.  Living alone sometimes leads to quick meal choices. She has tried healthier options like Dewey Fordyce bread and high protein cereals but finds them pricey. She is open to exploring more affordable healthy meal options.  She works night shifts and has a variable sleep schedule, but she reports getting about seven hours of sleep when she does rest.  She has adjusted to the night shift schedule over time and does not experience significant insomnia.  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 She has accepted a job at [employer], which will provide more structure to her schedule once she starts.  OBJECTIVE: Visit Diagnoses: Problem List Items Addressed This Visit     BMI 30.0-30.9,adult   Obesity, Beginning BMI 30.07   Vitamin D  deficiency - Primary   Hyperlipidemia  Obesity Obesity with recent weight gain due to stress eating and poor dietary habits influenced by financial and time constraints. Current diet is high in calories and carbohydrates, with limited physical activity due to a busy schedule and lack of structured exercise. Emphasized portion control, healthier food choices, and regular physical activity for weight management. Discussed benefits of low-carb, high-protein meals for satiety and reduced adipose storage. Encouraged meal planning services for healthier eating habits. - Encourage walking for 15-20 minutes within 20 minutes after dinner to manage blood glucose levels and reduce insulin  spikes. - Recommend keto bread from [employer] as a low-cost, low-carb alternative to regular bread. - Suggest baked oatmeal with fruits as a healthier breakfast option. - Advise visiting [employer] for healthier prepared meal options, noting potential cost-effectiveness compared to fast food. - Recommend using the Lose It app for calorie and protein tracking with a target of 1200-1300 calories and 85 grams of protein per day. - Encourage walking around [facility] three times a week for physical activity. - Schedule follow-up appointment with Dr. Pink Bridges on  June 26 at 2 PM. - Schedule follow-up appointment with current provider on July 28 at 8:30 AM.  Vitamin D  Deficiency Vitamin D  is not at goal of 50.  Most recent vitamin D  level was 9.3. She is on  prescription ergocalciferol  50,000 IU  weekly. Lab Results  Component Value Date   VD25OH 9.3 (L) 07/04/2023   VD25OH <10 ng/mL (L) 08/27/2009    Plan: Continue  prescription ergocalciferol  50,000 IU weekly Low vitamin D  levels can be associated with adiposity and may result in leptin resistance and weight gain. Also associated with fatigue.  Currently on vitamin D  supplementation without any adverse effects such as nausea, vomiting or muscle weakness.   Hyperlipidemia LDL is not at goal. HDL 47 at goal but on lower side. Trig 43- at goal Medication(s): none Cardiovascular risk factors: dyslipidemia, obesity (BMI >= 30 kg/m2), and sedentary lifestyle  Lab Results  Component Value Date   CHOL 162 07/04/2023   HDL 47 07/04/2023   LDLCALC 106 (H) 07/04/2023   TRIG 43 07/04/2023   CHOLHDL 2.8 12/02/2022   CHOLHDL 3.9 12/12/2018   CHOLHDL 2.8 08/08/2014   Lab Results  Component Value Date   ALT 12 07/04/2023   AST 21 07/04/2023   ALKPHOS 98 07/04/2023   BILITOT 0.3 07/04/2023   The ASCVD Risk score (Arnett DK, et al., 2019) failed to calculate for the following reasons:   The 2019 ASCVD risk score is only valid for ages 67 to 80  Plan: Continue to work on Engineer, technical sales -decreasing simple carbohydrates, increasing lean proteins, decreasing saturated fats and cholesterol , avoiding trans fats and exercise as able to promote weight loss, improve lipids and decrease cardiovascular risks.     Vitals Temp: 98.2 F (36.8 C) BP: 125/82 Pulse Rate: 66 SpO2: 100 %   Anthropometric Measurements Height: 5\' 7"  (1.702 m) Weight: 195 lb (88.5 kg) BMI (Calculated): 30.53 Weight at Last Visit: 191 lb Weight Lost Since Last Visit: 0 Weight Gained Since Last Visit: 4 lb Starting Weight: 191 lb Total Weight Loss (lbs): 0 lb (0 kg) Peak Weight: 192 lb   Body Composition  Body Fat %: 39.2 % Fat Mass (lbs): 76.6 lbs Muscle Mass (lbs): 112.8 lbs Total Body Water (lbs): 75.4 lbs Visceral Fat Rating : 7   Other  Clinical Data Fasting: No Labs: No Today's Visit #: 4 Starting Date: 07/04/23     ASSESSMENT AND PLAN:  Diet: Kenzi is currently in the action stage of change. As such, her goal is to continue with weight loss efforts. She has agreed to Category 2 Plan.  Exercise: Geraldin has been instructed to work up to a goal of 150 minutes of combined cardio and strengthening exercise per week for weight loss and overall health benefits.   Behavior Modification:  We discussed the following Behavioral Modification Strategies today: increasing lean protein intake, decreasing simple carbohydrates, increasing vegetables, increase H2O intake, increase high fiber foods, meal planning and cooking strategies, avoiding temptations, and planning for success. We discussed various medication options to help Velora with her weight loss efforts and we both agreed to continue to work on nutritional and behavioral strategies to promote weight loss.  .  Return in about 4 weeks (around 10/18/2023).Aaron Aas She was informed of the importance of frequent follow up visits to maximize her success with intensive lifestyle modifications for her multiple health conditions.  Attestation Statements:   Reviewed by clinician on day of visit: allergies, medications, problem list, medical history, surgical history,  family history, social history, and previous encounter notes.   Time spent on visit including pre-visit chart review and post-visit care and charting was 44 minutes.    Analynn Daum, PA-C

## 2023-09-22 ENCOUNTER — Other Ambulatory Visit: Payer: Self-pay

## 2023-10-03 ENCOUNTER — Other Ambulatory Visit (HOSPITAL_COMMUNITY): Payer: Self-pay

## 2023-10-11 DIAGNOSIS — F603 Borderline personality disorder: Secondary | ICD-10-CM | POA: Diagnosis not present

## 2023-10-11 DIAGNOSIS — F4310 Post-traumatic stress disorder, unspecified: Secondary | ICD-10-CM | POA: Diagnosis not present

## 2023-10-14 DIAGNOSIS — Z113 Encounter for screening for infections with a predominantly sexual mode of transmission: Secondary | ICD-10-CM | POA: Diagnosis not present

## 2023-10-14 DIAGNOSIS — N898 Other specified noninflammatory disorders of vagina: Secondary | ICD-10-CM | POA: Diagnosis not present

## 2023-10-14 DIAGNOSIS — R5383 Other fatigue: Secondary | ICD-10-CM | POA: Diagnosis not present

## 2023-10-21 ENCOUNTER — Other Ambulatory Visit (HOSPITAL_COMMUNITY): Payer: Self-pay

## 2023-10-22 ENCOUNTER — Other Ambulatory Visit (HOSPITAL_COMMUNITY): Payer: Self-pay

## 2023-10-27 ENCOUNTER — Ambulatory Visit (INDEPENDENT_AMBULATORY_CARE_PROVIDER_SITE_OTHER): Admitting: Family Medicine

## 2023-11-03 DIAGNOSIS — F603 Borderline personality disorder: Secondary | ICD-10-CM | POA: Diagnosis not present

## 2023-11-03 DIAGNOSIS — F4310 Post-traumatic stress disorder, unspecified: Secondary | ICD-10-CM | POA: Diagnosis not present

## 2023-11-07 ENCOUNTER — Ambulatory Visit: Payer: Self-pay | Admitting: Family Medicine

## 2023-11-07 NOTE — Telephone Encounter (Signed)
 FYI Only or Action Required?: FYI only for provider.  Patient was last seen in primary care on 07/18/2023 by [place] Adelita PENNER, MD. Called Nurse Triage reporting Ankle Injury. Symptoms began about a month ago. Symptoms are: gradually worsening.  Triage Disposition: See PCP When Office is Open (Within 3 Days)  Patient/caregiver understands and will follow disposition?: Yes            Copied from CRM [id number]. Topic: Clinical - Red Word Triage >> Nov 07, 2023 10:58 AM Montie POUR wrote: Red Word that prompted transfer to Nurse Triage:  She fell about 1 month ago and she thinks she hurt her left ankle. She has pain, swollen, and hard to walk on it. It has gotten worse. Reason for Disposition  [1] After 3 days AND [2] pain not improved  Answer Assessment - Initial Assessment Questions MECHANISM: How did the injury happen? (e.g., twisting injury, direct blow)      Fall after drinking alcohol; has gotten worse- at first no pain; now pain for 2 weeks ONSET: When did the injury happen? (Minutes or hours ago)      One month ago LOCATION: Where is the injury located?      Left ankle APPEARANCE of INJURY: What does the injury look like?      A little swollen, non-pitting WEIGHT-BEARING: Can you put weight on that foot? Can you walk (four steps or more)?       Yes PAIN: Is there pain? If Yes, ask: How bad is the pain?    (e.g., Scale 1-10; or mild, moderate, severe)   - NONE (0): no pain.   - MILD (1-3): doesn't interfere with normal activities.    - MODERATE (4-7): interferes with normal activities (e.g., work or school) or awakens from sleep, limping.    - SEVERE (8-10): excruciating pain, unable to do any normal activities, unable to walk.      Pain radiates up leg; 2/10 pain level, intermittent  Protocols used: Ankle and Foot Injury-A-AH

## 2023-11-09 ENCOUNTER — Ambulatory Visit
Admission: RE | Admit: 2023-11-09 | Discharge: 2023-11-09 | Disposition: A | Discharge status: Home / Self Care | Source: Ambulatory Visit | Attending: Physician Assistant

## 2023-11-09 ENCOUNTER — Ambulatory Visit: Payer: Self-pay | Admitting: Physician Assistant

## 2023-11-09 ENCOUNTER — Ambulatory Visit
Admission: RE | Admit: 2023-11-09 | Discharge: 2023-11-09 | Disposition: A | Discharge status: Home / Self Care | Attending: Physician Assistant | Admitting: Physician Assistant

## 2023-11-09 VITALS — BP 123/80 | HR 67 | Resp 16 | Ht 68.0 in | Wt 195.0 lb

## 2023-11-09 DIAGNOSIS — M25572 Pain in left ankle and joints of left foot: Secondary | ICD-10-CM

## 2023-11-09 DIAGNOSIS — M7989 Other specified soft tissue disorders: Secondary | ICD-10-CM | POA: Diagnosis not present

## 2023-11-09 NOTE — Progress Notes (Signed)
 Established patient visit  Patient: Amy Barrett   DOB: 12-02-1990   33 y.o. Female  MRN: [id number] Visit Date: 11/09/2023  Today's healthcare provider: Jolynn Spencer, PA-C   Chief Complaint  Patient presents with   Injury    Lt ankle x 1 month pt fell    Subjective     HPI     Injury    Additional comments: Lt ankle x 1 month pt fell       Last edited by Marylen Odella CROME, CMA on 11/09/2023  1:11 PM.       Discussed the use of AI scribe software for clinical note transcription with the patient, who gave verbal consent to proceed.  History of Present Illness Amy Barrett is a 33 year old female who presents with left ankle pain and swelling following a fall.  She experienced left ankle pain and swelling after a fall on her graduation night one month ago. Pain began two weeks post-incident, described as intermittent shooting pain. The ankle is slightly swollen and tender but does not impede walking. Elevation increases pain. Movement restriction is noted, especially with flexion, but no constant pain. Swelling is localized to the ankle.  There is no back pain or symptoms indicative of deep vein thrombosis. She has not been sexually active. 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       07/04/2023    8:46 AM 12/02/2022    8:39 AM 03/19/2022    9:55 AM  Depression screen PHQ 2/9  Decreased Interest 0 0 0  Down, Depressed, Hopeless 0 0 0  PHQ - 2 Score 0 0 0  Altered sleeping 1 0 0  Tired, decreased energy 1 0 0  Change in appetite 0 0 0  Feeling bad or failure about yourself  0 0 0  Trouble concentrating 2 0 0  Moving slowly or fidgety/restless 1 0 0  Suicidal thoughts 0 0 0  PHQ-9 Score 5 0 0  Difficult doing work/chores  Not difficult at all Not difficult at all       No data to display          Medications: Outpatient Medications Prior to Visit  Medication Sig   DULoxetine  (CYMBALTA ) 60 MG  capsule Take 1 capsule by mouth daily.   fluconazole  (DIFLUCAN ) 150 MG tablet Take 1 tablet by mouth once   ibuprofen  (ADVIL ) 800 MG tablet Take 1 tablet (800 mg total) by mouth every 8 (eight) hours as needed with food or milk.   Vitamin D , Ergocalciferol , (DRISDOL ) 1.25 MG (50000 UNIT) CAPS capsule Take 1 capsule (50,000 Units total) by mouth every 7 (seven) days.   No facility-administered medications prior to visit.    Review of Systems All negative Except see HPI       Objective    BP 123/80 (BP Location: Left Arm, Cuff Size: Normal)   Pulse 67   Resp 16   Ht 5' 8 (1.727 m)   Wt 195 lb (88.5 kg)   SpO2 100%   BMI 29.65 kg/m     Physical Exam Constitutional:      General: She is not in acute distress.    Appearance: Normal appearance.  HENT:     Head: Normocephalic.  Pulmonary:     Effort: Pulmonary effort is normal. No respiratory distress.  Musculoskeletal:        General: Tenderness (left ankle up to left knee on the  anterior, restriction with toes flexion) present.  Neurological:     Mental Status: She is alert and oriented to person, place, and time. Mental status is at baseline.      No results found for any visits on 11/09/23.      Assessment & Plan Left ankle pain with swelling Intermittent shooting pain and localized swelling post-fall. X-ray needed to rule out fracture. - Order x-ray of left ankle and foot to assess for fracture or injury. - Advise ice or heat for swelling and discomfort. - Recommend acetaminophen  or ibuprofen  with meals. - Suggest ankle taping for stability. - Proceed to outpatient imaging center for x-ray.   No orders of the defined types were placed in this encounter.   No follow-ups on file.   The patient was advised to call back or seek an in-person evaluation if the symptoms worsen or if the condition fails to improve as anticipated.  I discussed the assessment and treatment plan with the patient. The patient was  provided an opportunity to ask questions and all were answered. The patient agreed with the plan and demonstrated an understanding of the instructions.  I, Manjot Hinks, PA-C have reviewed all documentation for this visit. The documentation on 11/09/2023  for the exam, diagnosis, procedures, and orders are all accurate and complete.  Jolynn Spencer, [facility], MMS [facility] [phone] (phone) [phone] (fax)  [facility]

## 2023-11-11 ENCOUNTER — Telehealth: Payer: Self-pay

## 2023-11-11 ENCOUNTER — Telehealth: Admitting: Physician Assistant

## 2023-11-11 ENCOUNTER — Other Ambulatory Visit (HOSPITAL_COMMUNITY): Payer: Self-pay

## 2023-11-11 DIAGNOSIS — J069 Acute upper respiratory infection, unspecified: Secondary | ICD-10-CM

## 2023-11-11 DIAGNOSIS — M25572 Pain in left ankle and joints of left foot: Secondary | ICD-10-CM

## 2023-11-11 MED ORDER — CELECOXIB 200 MG PO CAPS
200.0000 mg | ORAL_CAPSULE | Freq: Two times a day (BID) | ORAL | 1 refills | Status: DC
  Filled 2023-11-11: qty 60, 30d supply, fill #0

## 2023-11-11 MED ORDER — FLUTICASONE PROPIONATE 50 MCG/ACT NA SUSP
2.0000 | Freq: Every day | NASAL | 0 refills | Status: DC
  Filled 2023-11-11: qty 16, 30d supply, fill #0

## 2023-11-11 MED ORDER — BENZONATATE 100 MG PO CAPS
100.0000 mg | ORAL_CAPSULE | Freq: Three times a day (TID) | ORAL | 0 refills | Status: DC | PRN
  Filled 2023-11-11: qty 30, 5d supply, fill #0

## 2023-11-11 NOTE — Telephone Encounter (Signed)
 Copied from CRM [id number]. Topic: Clinical - Medication Question >> Nov 11, 2023 10:32 AM Delon HERO wrote: Reason for CRM: Patient is calling to confirm the MyChart Message that she is in agreement for the  celebrex  called into pharmacy. [facility] [address] [place] [address] Phone: [phone] Fax: [phone] Hours: Mon-Fri 7:30am-6pm; Sat 8:00am-4:30pm

## 2023-11-11 NOTE — Progress Notes (Signed)

## 2023-11-16 ENCOUNTER — Other Ambulatory Visit (HOSPITAL_COMMUNITY): Payer: Self-pay

## 2023-11-17 ENCOUNTER — Other Ambulatory Visit (HOSPITAL_COMMUNITY): Payer: Self-pay

## 2023-11-17 MED ORDER — IBUPROFEN 800 MG PO TABS
ORAL_TABLET | ORAL | 0 refills | Status: AC
Start: 2023-11-17 — End: ?
  Filled 2023-11-17: qty 30, 10d supply, fill #0

## 2023-11-28 ENCOUNTER — Ambulatory Visit (INDEPENDENT_AMBULATORY_CARE_PROVIDER_SITE_OTHER): Admitting: Physician Assistant

## 2023-12-12 DIAGNOSIS — F4310 Post-traumatic stress disorder, unspecified: Secondary | ICD-10-CM | POA: Diagnosis not present

## 2023-12-12 DIAGNOSIS — F603 Borderline personality disorder: Secondary | ICD-10-CM | POA: Diagnosis not present

## 2023-12-29 ENCOUNTER — Telehealth: Admitting: Physician Assistant

## 2023-12-29 ENCOUNTER — Other Ambulatory Visit (HOSPITAL_COMMUNITY): Payer: Self-pay

## 2023-12-29 DIAGNOSIS — B3731 Acute candidiasis of vulva and vagina: Secondary | ICD-10-CM

## 2023-12-29 MED ORDER — FLUCONAZOLE 150 MG PO TABS
ORAL_TABLET | ORAL | 0 refills | Status: AC
  Filled 2023-12-29: qty 2, 6d supply, fill #0

## 2023-12-29 MED ORDER — FLUCONAZOLE 150 MG PO TABS
150.0000 mg | ORAL_TABLET | Freq: Once | ORAL | 0 refills | Status: DC
  Filled 2023-12-29: qty 1, 1d supply, fill #0

## 2023-12-29 NOTE — Progress Notes (Signed)

## 2023-12-29 NOTE — Progress Notes (Signed)
 I have spent 5 minutes in review of e-visit questionnaire, review and updating patient chart, medical decision making and response to patient.   Elsie Velma Lunger, PA-C

## 2023-12-29 NOTE — Addendum Note (Signed)
 Addended by: GLADIS ELSIE BROCKS on: 12/29/2023 01:58 PM   Modules accepted: Orders

## 2024-01-04 DIAGNOSIS — F4310 Post-traumatic stress disorder, unspecified: Secondary | ICD-10-CM | POA: Diagnosis not present

## 2024-01-04 DIAGNOSIS — F603 Borderline personality disorder: Secondary | ICD-10-CM | POA: Diagnosis not present

## 2024-01-23 ENCOUNTER — Ambulatory Visit: Admitting: Family Medicine

## 2024-01-23 ENCOUNTER — Encounter: Payer: Self-pay | Admitting: Family Medicine

## 2024-01-23 ENCOUNTER — Ambulatory Visit (INDEPENDENT_AMBULATORY_CARE_PROVIDER_SITE_OTHER): Admitting: Family Medicine

## 2024-01-23 ENCOUNTER — Other Ambulatory Visit (HOSPITAL_COMMUNITY)
Admission: RE | Admit: 2024-01-23 | Discharge: 2024-01-23 | Disposition: A | Source: Ambulatory Visit | Attending: Family Medicine | Admitting: Family Medicine

## 2024-01-23 VITALS — BP 119/86 | HR 65 | Temp 98.8°F | Ht 68.0 in | Wt 190.0 lb

## 2024-01-23 DIAGNOSIS — Z131 Encounter for screening for diabetes mellitus: Secondary | ICD-10-CM

## 2024-01-23 DIAGNOSIS — Z124 Encounter for screening for malignant neoplasm of cervix: Secondary | ICD-10-CM | POA: Diagnosis present

## 2024-01-23 DIAGNOSIS — N898 Other specified noninflammatory disorders of vagina: Secondary | ICD-10-CM

## 2024-01-23 DIAGNOSIS — M419 Scoliosis, unspecified: Secondary | ICD-10-CM

## 2024-01-23 DIAGNOSIS — Z01411 Encounter for gynecological examination (general) (routine) with abnormal findings: Secondary | ICD-10-CM | POA: Diagnosis not present

## 2024-01-23 DIAGNOSIS — Z0000 Encounter for general adult medical examination without abnormal findings: Secondary | ICD-10-CM

## 2024-01-23 DIAGNOSIS — Z01419 Encounter for gynecological examination (general) (routine) without abnormal findings: Secondary | ICD-10-CM

## 2024-01-23 DIAGNOSIS — Z113 Encounter for screening for infections with a predominantly sexual mode of transmission: Secondary | ICD-10-CM

## 2024-01-23 DIAGNOSIS — F419 Anxiety disorder, unspecified: Secondary | ICD-10-CM

## 2024-01-23 DIAGNOSIS — Z114 Encounter for screening for human immunodeficiency virus [HIV]: Secondary | ICD-10-CM | POA: Diagnosis not present

## 2024-01-23 DIAGNOSIS — Z130 Encounter for screening for diseases of the blood and blood-forming organs and certain disorders involving the immune mechanism: Secondary | ICD-10-CM | POA: Diagnosis not present

## 2024-01-23 DIAGNOSIS — E559 Vitamin D deficiency, unspecified: Secondary | ICD-10-CM | POA: Diagnosis not present

## 2024-01-23 DIAGNOSIS — E663 Overweight: Secondary | ICD-10-CM | POA: Diagnosis not present

## 2024-01-23 DIAGNOSIS — E7800 Pure hypercholesterolemia, unspecified: Secondary | ICD-10-CM | POA: Diagnosis not present

## 2024-01-23 DIAGNOSIS — G4709 Other insomnia: Secondary | ICD-10-CM | POA: Diagnosis not present

## 2024-01-23 DIAGNOSIS — E538 Deficiency of other specified B group vitamins: Secondary | ICD-10-CM | POA: Diagnosis not present

## 2024-01-23 NOTE — Patient Instructions (Signed)
 It was a pleasure to see you today!  Thank you for choosing [facility] for your primary care.   Today you were seen for your annual physical  Please review the attached information regarding helpful preventive health topics.   To keep you healthy, please keep in mind the following health maintenance items that you are due for:   Health Maintenance Due  Topic Date Due   Hepatitis B Vaccines 19-59 Average Risk (1 of 3 - 19+ 3-dose series) Never done   HPV VACCINES (1 - 3-dose SCDM series) Never done   Influenza Vaccine  12/02/2023   COVID-19 Vaccine (4 - 2025-26 season) 01/02/2024     Best Wishes,   Dr. Lang  To keep you healthy, please keep in mind the following health maintenance items that you are due for:   Health Maintenance Due  Topic Date Due   Hepatitis B Vaccines 19-59 Average Risk (1 of 3 - 19+ 3-dose series) Never done   HPV VACCINES (1 - 3-dose SCDM series) Never done   Influenza Vaccine  12/02/2023   COVID-19 Vaccine (4 - 2025-26 season) 01/02/2024     Best Wishes,   Dr. Lang

## 2024-01-23 NOTE — Progress Notes (Signed)
 Complete physical exam   Patient: Amy Barrett   DOB: May 15, 1990   33 y.o. Female  MRN: [id number] Visit Date: 01/23/2024  Today's healthcare provider: Rockie Agent, MD   Chief Complaint  Patient presents with   Annual Exam    Patient is present for annual exam and STD panel.  Patient is eating a normal diet, no exercise currently    Subjective    Amy Barrett is a 32 y.o. female who presents today for a complete physical exam.    She does have additional problems to discuss today.   Discussed the use of AI scribe software for clinical note transcription with the patient, who gave verbal consent to proceed.  History of Present Illness Amy Barrett is a 33 year old female who presents for an annual physical exam and STI screening.  She has been experiencing vaginal discharge and pain, described as 'irritated' and 'it hurts,' not specifically associated with intercourse. These symptoms have been present for about a week, following her menstrual cycle.  She has a history of folate deficiency and vitamin D  deficiency. She is also due for a Pap smear, having had an abnormal result in 2021, and requests an updated test.  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 She works night shifts and has been doing so for many years, expressing a preference for this schedule.     Past Medical History:  Diagnosis Date   Allergy    Back pain    BV (bacterial vaginosis)    Dysmenorrhea since adolescence   US  in 2011 that did not show any acute findings. Seen by Dr. German   Hyperlipidemia    Scoliosis    Yeast vaginitis    Past Surgical History:  Procedure Laterality Date   WISDOM TOOTH EXTRACTION     2018   Social History   Socioeconomic History   Marital status: Single    Spouse name: Not on file   Number of children: Not on file   Years of education: Not on file   Highest education level: Associate degree:  occupational, Scientist, product/process development, or vocational program  Occupational History   Not on file  Tobacco Use   Smoking status: Former    Types: Cigars   Smokeless tobacco: Never   Tobacco comments:    Black and miles  Advertising account planner   Vaping status: Never Used  Substance and Sexual Activity   Alcohol use: Yes    Comment: occasional, social   Drug use: No   Sexual activity: Yes    Partners: Male    Birth control/protection: Coitus interruptus    Comment: intercourse age 6, more than 5 sexual partners  Other Topics Concern   Not on file  Social History Narrative   Not on file   Social Drivers of Health   Financial Resource Strain: Low Risk  (11/09/2023)   Overall Financial Resource Strain (CARDIA)    Difficulty of Paying Living Expenses: Not hard at all  Food Insecurity: No Food Insecurity (11/09/2023)   Hunger Vital Sign    Worried About Running Out of Food in the Last Year: Never true    Ran Out of Food in the Last Year: Never true  Transportation Needs: No Transportation Needs (11/09/2023)   PRAPARE - Administrator, Civil Service (Medical): No    Lack of Transportation (Non-Medical): No  Physical Activity: Inactive (11/09/2023)   Exercise Vital Sign  Days of Exercise per Week: 0 days    Minutes of Exercise per Session: Not on file  Stress: No Stress Concern Present (11/09/2023)   [employer] of Occupational Health - Occupational Stress Questionnaire    Feeling of Stress: Only a little  Social Connections: Unknown (11/09/2023)   Social Connection and Isolation Panel    Frequency of Communication with Friends and Family: More than three times a week    Frequency of Social Gatherings with Friends and Family: Three times a week    Attends Religious Services: 1 to 4 times per year    Active Member of Clubs or Organizations: No    Attends Engineer, structural: Not on file    Marital Status: Patient declined  Intimate Partner Violence: Not on file   Family Status   Relation Name Status   Mother Ann [employer] Alive   Father Aida seabrooks Alive   [employer] Alive   [employer]  Deceased   Son  (Not Specified)   Mat Uncle  Alive   Pat Aunt Mercer seabrooks Deceased   Pat Aunt Erminio seabrooks (Not Specified)   Bruna Brigham Uncle boy Alive  No partnership data on file   Family History  Problem Relation Age of Onset   Heart disease Mother    Hyperlipidemia Mother    Hypertension Mother    Drug abuse Mother    Drug abuse Father    Emphysema Father    COPD Father    Breast cancer Maternal Grandmother 38   Hypertension Maternal Grandmother    Breast cancer Son    Cancer Paternal Aunt    Cancer Paternal Aunt    Breast cancer Paternal Uncle    Cancer Paternal Uncle    No Known Allergies   Medications: Outpatient Medications Prior to Visit  Medication Sig   DULoxetine  (CYMBALTA ) 60 MG capsule Take 1 capsule by mouth daily.   fluconazole  (DIFLUCAN ) 150 MG tablet Take 1 tablet by mouth once. Repeat in 3 days.   ibuprofen  (ADVIL ) 800 MG tablet Take 1 tablet (800 mg total) by mouth every 8 (eight) hours as needed with food or milk.   Vitamin D , Ergocalciferol , (DRISDOL ) 1.25 MG (50000 UNIT) CAPS capsule Take 1 capsule (50,000 Units total) by mouth every 7 (seven) days.   No facility-administered medications prior to visit.    Review of Systems  Last CBC Lab Results  Component Value Date   WBC 8.4 07/04/2023   HGB 12.2 07/04/2023   HCT 37.2 07/04/2023   MCV 88 07/04/2023   MCH 28.9 07/04/2023   RDW 11.9 07/04/2023   PLT 290 07/04/2023   Last metabolic panel Lab Results  Component Value Date   GLUCOSE 89 07/04/2023   NA 137 07/04/2023   K 4.4 07/04/2023   CL 103 07/04/2023   CO2 20 07/04/2023   BUN 13 07/04/2023   CREATININE 0.73 07/04/2023   EGFR 110 07/04/2023   CALCIUM 9.2 07/04/2023   PROT 7.5 07/04/2023   ALBUMIN 4.5 07/04/2023   LABGLOB 3.0 07/04/2023   AGRATIO 2.1 12/03/2020   BILITOT 0.3 07/04/2023   ALKPHOS 98  07/04/2023   AST 21 07/04/2023   ALT 12 07/04/2023   ANIONGAP 11 09/21/2021   Last lipids Lab Results  Component Value Date   CHOL 162 07/04/2023   HDL 47 07/04/2023   LDLCALC 106 (H) 07/04/2023   TRIG 43 07/04/2023   CHOLHDL 2.8 12/02/2022   Last hemoglobin A1c Lab Results  Component Value  Date   HGBA1C 5.0 07/04/2023   Last thyroid  functions Lab Results  Component Value Date   TSH 0.616 07/04/2023   T3TOTAL 116 07/04/2023   Last vitamin D  Lab Results  Component Value Date   VD25OH 9.3 (L) 07/04/2023   Last vitamin B12 and Folate Lab Results  Component Value Date   VITAMINB12 338 07/04/2023   FOLATE 2.4 (L) 07/04/2023       Objective    BP 119/86 (BP Location: Left Arm, Patient Position: Sitting, Cuff Size: Normal)   Pulse 65   Temp 98.8 F (37.1 C) (Oral)   Ht 5' 8 (1.727 m)   Wt 190 lb (86.2 kg)   LMP 01/10/2024 (Exact Date)   SpO2 100%   BMI 28.89 kg/m  BP Readings from Last 3 Encounters:  01/23/24 119/86  11/09/23 123/80  09/20/23 125/82   Wt Readings from Last 3 Encounters:  01/23/24 190 lb (86.2 kg)  11/09/23 195 lb (88.5 kg)  09/20/23 195 lb (88.5 kg)        Physical Exam Vitals reviewed. Exam conducted with a chaperone present.  Constitutional:      General: She is not in acute distress.    Appearance: Normal appearance. She is not ill-appearing, toxic-appearing or diaphoretic.  HENT:     Head: Normocephalic and atraumatic.     Right Ear: Tympanic membrane and external ear normal. There is no impacted cerumen.     Left Ear: Tympanic membrane and external ear normal. There is no impacted cerumen.     Nose: Nose normal.     Mouth/Throat:     Pharynx: Oropharynx is clear.  Eyes:     General: No scleral icterus.    Extraocular Movements: Extraocular movements intact.     Conjunctiva/sclera: Conjunctivae normal.     Pupils: Pupils are equal, round, and reactive to light.  Cardiovascular:     Rate and Rhythm: Normal rate and  regular rhythm.     Pulses: Normal pulses.     Heart sounds: Normal heart sounds. No murmur heard.    No friction rub. No gallop.  Pulmonary:     Effort: Pulmonary effort is normal. No respiratory distress.     Breath sounds: Normal breath sounds. No wheezing, rhonchi or rales.  Chest:  Breasts:    Tanner Score is 5.     Breasts are symmetrical.     Right: Normal. No bleeding, mass, nipple discharge, skin change or tenderness.     Left: Normal. No bleeding, mass, nipple discharge or tenderness.  Abdominal:     General: Bowel sounds are normal. There is no distension.     Palpations: Abdomen is soft. There is no mass.     Tenderness: There is no abdominal tenderness. There is no guarding.  Genitourinary:    General: Normal vulva.     Pubic Area: No rash or pubic lice.      Labia:        Right: No rash, tenderness, lesion or injury.        Left: No rash, tenderness, lesion or injury.      Urethra: No urethral pain, urethral swelling or urethral lesion.     Vagina: No vaginal discharge, erythema, tenderness, bleeding or lesions.     Cervix: Normal.     Uterus: Normal.      Adnexa: Right adnexa normal and left adnexa normal.     Rectum: Normal.  Musculoskeletal:        General: No deformity.  Cervical back: Normal range of motion and neck supple.     Right lower leg: No edema.     Left lower leg: No edema.  Lymphadenopathy:     Cervical: No cervical adenopathy.  Skin:    General: Skin is warm.     Capillary Refill: Capillary refill takes less than 2 seconds.     Findings: No erythema or rash.  Neurological:     General: No focal deficit present.     Mental Status: She is alert and oriented to person, place, and time.     Cranial Nerves: Cranial nerves 2-12 are intact. No cranial nerve deficit or facial asymmetry.     Motor: Motor function is intact. No weakness.     Gait: Gait normal.  Psychiatric:        Mood and Affect: Mood normal.        Behavior: Behavior normal.        Last depression screening scores    01/23/2024   11:12 AM 11/09/2023    1:16 PM 07/04/2023    8:46 AM  PHQ 2/9 Scores  PHQ - 2 Score 0 0 0  PHQ- 9 Score 0 1 5    Last fall risk screening    01/23/2024   11:12 AM  Fall Risk   Falls in the past year? 1  Number falls in past yr: 0  Injury with Fall? 0  Risk for fall due to : No Fall Risks  Follow up Falls evaluation completed    Last Audit-C alcohol use screening    11/09/2023   10:12 AM  Alcohol Use Disorder Test (AUDIT)  1. How often do you have a drink containing alcohol? 3  2. How many drinks containing alcohol do you have on a typical day when you are drinking? 0  3. How often do you have six or more drinks on one occasion? 0  AUDIT-C Score 3   4. How often during the last year have you found that you were not able to stop drinking once you had started? 0  5. How often during the last year have you failed to do what was normally expected from you because of drinking? 0  6. How often during the last year have you needed a first drink in the morning to get yourself going after a heavy drinking session? 0  7. How often during the last year have you had a feeling of guilt of remorse after drinking? 0  8. How often during the last year have you been unable to remember what happened the night before because you had been drinking? 0     Patient-reported   A score of 3 or more in women, and 4 or more in men indicates increased risk for alcohol abuse, EXCEPT if all of the points are from question 1   No results found for any visits on 01/23/24.  Assessment & Plan    Routine Health Maintenance and Physical Exam  Immunization History  Administered Date(s) Administered   Dtap, Unspecified 09/06/1988, 12/02/1988, 05/16/1989, 12/09/1989, 11/19/1992   HIB, Unspecified 10/19/1989   Hep B, Unspecified 02/04/2000, 03/09/2000, 08/10/2000   Influenza,inj,Quad PF,6+ Mos 02/14/2013   Influenza,inj,Quad PF,6-35 Mos 02/11/2017    Influenza-Unspecified 02/13/2019, 01/28/2020, 01/28/2022   MMR 10/19/1989, 11/19/1992   PFIZER(Purple Top)SARS-COV-2 Vaccination 08/24/2019, 09/21/2019, 04/16/2020   PPD Test 10/06/2021   Polio, Unspecified 09/06/1988, 12/02/1988, 10/19/1989, 11/19/1992   Tdap 05/19/2006, 10/23/2020    Health Maintenance  Topic Date Due  HPV VACCINES (1 - 3-dose SCDM series) Never done   COVID-19 Vaccine (4 - 2025-26 season) 02/08/2024 (Originally 01/02/2024)   Influenza Vaccine  07/31/2024 (Originally 12/02/2023)   Cervical Cancer Screening (HPV/Pap Cotest)  12/18/2024   DTaP/Tdap/Td (8 - Td or Tdap) 10/24/2030   Hepatitis C Screening  Completed   HIV Screening  Completed   Pneumococcal Vaccine  Aged Out   Meningococcal B Vaccine  Aged Out    Problem List Items Addressed This Visit     Annual physical exam - Primary   Anxiety   Folate deficiency   Relevant Orders   Folate   Hyperlipidemia   Relevant Orders   Lipid panel   Other insomnia   Relevant Orders   CMP14+EGFR   Scoliosis   Vitamin D  deficiency   Relevant Orders   Vitamin D  (25 hydroxy)   Other Visit Diagnoses       Well woman exam with routine gynecological exam         Screening for deficiency anemia       Relevant Orders   CBC     Screening for diabetes mellitus       Relevant Orders   Hemoglobin A1c     Screening for HIV (human immunodeficiency virus)       Relevant Orders   HIV Antibody (routine testing w rflx)     Cervical cancer screening       Relevant Orders   Cytology - PAP     Routine screening for STI (sexually transmitted infection)       Relevant Orders   RPR     Overweight (BMI 25.0-29.9)       Relevant Orders   Hemoglobin A1c   CMP14+EGFR   Lipid panel   TSH + free T4     Vaginal discharge       Relevant Orders   Cervicovaginal ancillary only     Vaginal irritation       Relevant Orders   Cervicovaginal ancillary only       Assessment and Plan Assessment & Plan Genitourinary symptoms  (vaginal discharge, pain, irritation) Reports vaginal discharge and irritation for about a week, following her menstrual cycle. History of chlamydia infection in May. Concerned about possible reinfection. - Perform vaginal swab for gonorrhea, chlamydia, and trichomonas -collected pap smear  HIV,RPR collected   Adult Wellness Visit Routine annual physical examination. Screening tests for diabetes, HIV, and syphilis are planned. 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 - Order CBC, CMP, A1c, HIV screening, and syphilis screening - Schedule next annual physical exam -recommended 150 mins of exercise weekly along with well balanced diet -See AVS for additional recommendations for wellness   Vitamin D  deficiency Vitamin D  deficiency to be evaluated with testing. - Order vitamin D  level test  Folate deficiency Folate deficiency to be evaluated with testing. - Order folate level test  General Health Maintenance Requests updated Pap smear due to history of abnormal Pap smear, last done in 2021. - Perform Pap smear       Return in about 2 months (around 03/24/2024) for FMLA scoliosis .       Rockie Agent, MD  [facility] [phone] (phone) [phone] (fax)  [facility]

## 2024-01-24 ENCOUNTER — Encounter: Payer: Self-pay | Admitting: Family Medicine

## 2024-01-24 LAB — CMP14+EGFR
ALT: 9 IU/L (ref 0–32)
AST: 17 IU/L (ref 0–40)
Albumin: 4.7 g/dL (ref 3.9–4.9)
Alkaline Phosphatase: 94 IU/L (ref 41–116)
BUN/Creatinine Ratio: 14 (ref 9–23)
BUN: 12 mg/dL (ref 6–20)
Bilirubin Total: 0.3 mg/dL (ref 0.0–1.2)
CO2: 20 mmol/L (ref 20–29)
Calcium: 9.8 mg/dL (ref 8.7–10.2)
Chloride: 102 mmol/L (ref 96–106)
Creatinine, Ser: 0.85 mg/dL (ref 0.57–1.00)
Globulin, Total: 2.6 g/dL (ref 1.5–4.5)
Glucose: 84 mg/dL (ref 70–99)
Potassium: 4.2 mmol/L (ref 3.5–5.2)
Sodium: 139 mmol/L (ref 134–144)
Total Protein: 7.3 g/dL (ref 6.0–8.5)
eGFR: 92 mL/min/1.73 (ref >59)

## 2024-01-24 LAB — HIV ANTIBODY (ROUTINE TESTING W REFLEX): HIV Screen 4th Generation wRfx: NONREACTIVE

## 2024-01-24 LAB — LIPID PANEL
Chol/HDL Ratio: 3.8 ratio (ref 0.0–4.4)
Cholesterol, Total: 189 mg/dL (ref 100–199)
HDL: 50 mg/dL (ref >39)
LDL Chol Calc (NIH): 130 mg/dL — ABNORMAL HIGH (ref 0–99)
Triglycerides: 49 mg/dL (ref 0–149)
VLDL Cholesterol Cal: 9 mg/dL (ref 5–40)

## 2024-01-24 LAB — VITAMIN D 25 HYDROXY (VIT D DEFICIENCY, FRACTURES): Vit D, 25-Hydroxy: 44 ng/mL (ref 30.0–100.0)

## 2024-01-24 LAB — HEMOGLOBIN A1C
Est. average glucose Bld gHb Est-mCnc: 94 mg/dL
Hgb A1c MFr Bld: 4.9 % (ref 4.8–5.6)

## 2024-01-24 LAB — CERVICOVAGINAL ANCILLARY ONLY
Bacterial Vaginitis (gardnerella): NEGATIVE
Candida Glabrata: NEGATIVE
Candida Vaginitis: NEGATIVE
Comment: NEGATIVE
Comment: NEGATIVE
Comment: NEGATIVE

## 2024-01-24 LAB — CBC
Hematocrit: 38.4 % (ref 34.0–46.6)
Hemoglobin: 12.3 g/dL (ref 11.1–15.9)
MCH: 28.8 pg (ref 26.6–33.0)
MCHC: 32 g/dL (ref 31.5–35.7)
MCV: 90 fL (ref 79–97)
Platelets: 256 x10E3/uL (ref 150–450)
RBC: 4.27 x10E6/uL (ref 3.77–5.28)
RDW: 12.5 % (ref 11.7–15.4)
WBC: 8.6 x10E3/uL (ref 3.4–10.8)

## 2024-01-24 LAB — FOLATE: Folate: 10.9 ng/mL (ref >3.0)

## 2024-01-24 LAB — TSH+FREE T4
Free T4: 0.99 ng/dL (ref 0.82–1.77)
TSH: 0.903 u[IU]/mL (ref 0.450–4.500)

## 2024-01-24 LAB — RPR: RPR Ser Ql: NONREACTIVE

## 2024-01-25 ENCOUNTER — Ambulatory Visit: Payer: Self-pay | Admitting: Family Medicine

## 2024-01-27 ENCOUNTER — Encounter: Payer: Self-pay | Admitting: Family Medicine

## 2024-01-31 LAB — CYTOLOGY - PAP
Adequacy: ABSENT
Chlamydia: NEGATIVE
Comment: NEGATIVE
Comment: NEGATIVE
Comment: NEGATIVE
Comment: NEGATIVE
Comment: NEGATIVE
Comment: NORMAL
HPV 16: NEGATIVE
HPV 18 / 45: NEGATIVE
High risk HPV: NEGATIVE
Neisseria Gonorrhea: NEGATIVE
Trichomonas: NEGATIVE

## 2024-02-21 ENCOUNTER — Other Ambulatory Visit: Payer: Self-pay | Admitting: Family Medicine

## 2024-02-21 DIAGNOSIS — M419 Scoliosis, unspecified: Secondary | ICD-10-CM

## 2024-02-21 DIAGNOSIS — M545 Low back pain, unspecified: Secondary | ICD-10-CM

## 2024-02-21 DIAGNOSIS — S134XXA Sprain of ligaments of cervical spine, initial encounter: Secondary | ICD-10-CM

## 2024-02-22 ENCOUNTER — Other Ambulatory Visit (HOSPITAL_COMMUNITY): Payer: Self-pay

## 2024-02-22 ENCOUNTER — Other Ambulatory Visit: Payer: Self-pay

## 2024-02-22 MED ORDER — DULOXETINE HCL 60 MG PO CPEP
60.0000 mg | ORAL_CAPSULE | Freq: Every day | ORAL | 3 refills | Status: AC
  Filled 2024-02-22: qty 90, 90d supply, fill #0

## 2024-03-14 ENCOUNTER — Encounter: Payer: Self-pay | Admitting: Family Medicine

## 2024-03-14 ENCOUNTER — Telehealth: Admitting: Family Medicine

## 2024-03-14 DIAGNOSIS — M419 Scoliosis, unspecified: Secondary | ICD-10-CM

## 2024-03-14 DIAGNOSIS — M545 Low back pain, unspecified: Secondary | ICD-10-CM

## 2024-03-14 DIAGNOSIS — G8929 Other chronic pain: Secondary | ICD-10-CM

## 2024-03-14 MED ORDER — CYCLOBENZAPRINE HCL 10 MG PO TABS: 10.0000 mg | ORAL_TABLET | Freq: Three times a day (TID) | ORAL | Status: DC | PRN

## 2024-03-14 NOTE — Patient Instructions (Signed)
 To keep you healthy, please keep in mind the following health maintenance items that you are due for:   Health Maintenance Due  Topic Date Due   HPV VACCINES (1 - 3-dose SCDM series) Never done     Best Wishes,   Dr. Lang

## 2024-03-14 NOTE — Progress Notes (Signed)
 MyChart Video Visit    Virtual Visit via Video Note   This format is felt to be most appropriate for this patient at this time. Physical exam was limited by quality of the video and audio technology used for the visit.   Patient location: Patient's home address   Provider location: [facility]  [address]  [place], [place] [address]   I discussed the limitations of evaluation and management by telemedicine and the availability of in person appointments. The patient expressed understanding and agreed to proceed.  Patient: Amy Barrett   DOB: 11-Jun-1990   33 y.o. Female  MRN: [id number] Visit Date: 03/14/2024  Today's healthcare provider: Rockie Agent, MD   Chief Complaint  Patient presents with  . Back Pain   Subjective    HPI   Discussed the use of AI scribe software for clinical note transcription with the patient, who gave verbal consent to proceed.  History of Present Illness Amy Barrett is a 33 year old female with scoliosis and chronic low back pain who presents for follow-up regarding FMLA paperwork and worsening back pain.  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 She experiences increased fatigue and soreness by the end of her shifts, especially since she is currently on day shifts, which are more physically demanding.  She has been using Flexeril  10 mg three times a day as needed, which she carries to work, and ibuprofen  for pain management. She also uses a pillow for back support while sleeping, but reports poor sleep quality due to back soreness.  Her scoliosis has been previously managed with intermittent leave under FMLA, allowing for three episodes per month for one day each. Her last lumbar x-ray was approximately five years ago when she was first diagnosed.  She has tried various treatments for her back pain, including physical therapy, a heating pad, and a  TENS unit, but has not pursued surgical options. She is contemplating using a back brace for additional support during active periods.  She is currently in orientation for a night shift position and plans to transition to this schedule at the end of next month. She plans to transition to this schedule at the end of next month.      Past Medical History:  Diagnosis Date  . Allergy   . Back pain   . BV (bacterial vaginosis)   . Dysmenorrhea since adolescence   US  in 2011 that did not show any acute findings. Seen by Dr. German  . Hyperlipidemia   . Scoliosis   . Yeast vaginitis        01/23/2024   11:12 AM 11/09/2023    1:16 PM 07/04/2023    8:46 AM  PHQ9 SCORE ONLY  PHQ-9 Total Score 0 1  5      Data saved with a previous flowsheet row definition       01/23/2024   11:12 AM 11/09/2023    1:16 PM  GAD 7 : Generalized Anxiety Score  Nervous, Anxious, on Edge 0 0  Control/stop worrying 0 0  Worry too much - different things 0 0  Trouble relaxing 0 0  Restless 0 0  Easily annoyed or irritable 0 0  Afraid - awful might happen 0 0  Total GAD 7 Score 0 0  Anxiety Difficulty  Not difficult at all     Medications: Outpatient Medications Prior to Visit  Medication Sig  . [  DISCONTINUED] cyclobenzaprine  (FLEXERIL ) 10 MG tablet Take 10 mg by mouth 3 (three) times daily as needed for muscle spasms.  . DULoxetine  (CYMBALTA ) 60 MG capsule Take 1 capsule by mouth daily.  . fluconazole  (DIFLUCAN ) 150 MG tablet Take 1 tablet by mouth once. Repeat in 3 days.  . ibuprofen  (ADVIL ) 800 MG tablet Take 1 tablet (800 mg total) by mouth every 8 (eight) hours as needed with food or milk.  . Vitamin D , Ergocalciferol , (DRISDOL ) 1.25 MG (50000 UNIT) CAPS capsule Take 1 capsule (50,000 Units total) by mouth every 7 (seven) days.   No facility-administered medications prior to visit.    Review of Systems  Last lipids Lab Results  Component Value Date   CHOL 189 01/23/2024   HDL 50 01/23/2024    LDLCALC 130 (H) 01/23/2024   TRIG 49 01/23/2024   CHOLHDL 3.8 01/23/2024        Objective    There were no vitals taken for this visit.    Physical Exam Vitals reviewed.  Constitutional:      General: She is not in acute distress.    Appearance: Normal appearance. She is not ill-appearing.  Pulmonary:     Effort: Pulmonary effort is normal. No respiratory distress.  Neurological:     Mental Status: She is alert and oriented to person, place, and time.  Psychiatric:        Mood and Affect: Mood normal.        Behavior: Behavior normal.        Thought Content: Thought content normal.      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 Pain is manageable with current treatment regimen. Surgery is not an option. Last lumbar x-ray was five years ago, and there is interest in assessing current progression. - Ordered lumbar x-ray to assess progression of scoliosis - Continue current pain management regimen including ibuprofen , Flexeril , heating pad, and TENS unit - Consider trial of back brace for additional support during activity - Ensure FMLA paperwork is updated and submitted as needed  Hyperlipidemia Chronic  Previous LDL of 130 mg/dL. She acknowledges difficulty with exercise and dietary adherence. - Plan for cholesterol recheck in March - Encouraged dietary modifications and exercise as tolerated     Return in about 4 months (around 07/12/2024) for Cholesterol.     I discussed the assessment and treatment plan with the patient.  The patient was provided an opportunity to ask questions and all were answered. The patient agreed with the plan and demonstrated an understanding of the instructions.   The patient was advised to call back or seek an in-person evaluation if the symptoms worsen or if the condition fails to improve as anticipated.  I provided 22 minutes of non-face-to-face time during this encounter.   Rockie Agent, MD [facility] [phone] (phone) [phone] (fax)  [facility]

## 2024-03-16 ENCOUNTER — Telehealth: Payer: Self-pay | Admitting: Family Medicine

## 2024-03-16 NOTE — Telephone Encounter (Signed)
 Received FMLA from [employer] through fax.  Placed in Dr. Feliberto box.

## 2024-03-20 DIAGNOSIS — Z0279 Encounter for issue of other medical certificate: Secondary | ICD-10-CM

## 2024-03-20 NOTE — Telephone Encounter (Signed)
 Received another fax from [facility] for FMLA.  Not sure if it is the same.  Placed in Dr. Feliberto box

## 2024-03-23 NOTE — Telephone Encounter (Signed)
 Completed and faxed. Scan available in media tab

## 2024-04-02 ENCOUNTER — Encounter: Payer: Self-pay | Admitting: Family Medicine

## 2024-04-11 ENCOUNTER — Other Ambulatory Visit (HOSPITAL_COMMUNITY): Payer: Self-pay

## 2024-04-16 ENCOUNTER — Encounter: Payer: Self-pay | Admitting: Family Medicine

## 2024-04-17 ENCOUNTER — Other Ambulatory Visit (HOSPITAL_COMMUNITY): Payer: Self-pay

## 2024-04-17 MED ORDER — IBUPROFEN 800 MG PO TABS
800.0000 mg | ORAL_TABLET | Freq: Three times a day (TID) | ORAL | 0 refills | Status: DC
  Filled 2024-04-17: qty 30, 10d supply, fill #0

## 2024-04-18 ENCOUNTER — Encounter: Payer: Self-pay | Admitting: Family Medicine

## 2024-04-18 MED ORDER — IBUPROFEN 800 MG PO TABS
ORAL_TABLET | ORAL | 1 refills | Status: AC
  Filled 2024-04-18 – 2024-06-07 (×3): qty 30, 10d supply, fill #0

## 2024-04-19 ENCOUNTER — Other Ambulatory Visit (HOSPITAL_COMMUNITY): Payer: Self-pay

## 2024-04-19 ENCOUNTER — Other Ambulatory Visit: Payer: Self-pay

## 2024-04-20 NOTE — Telephone Encounter (Signed)
 Done and will leave in your box on [address] for your initials on monday

## 2024-04-27 ENCOUNTER — Other Ambulatory Visit (HOSPITAL_COMMUNITY): Payer: Self-pay

## 2024-06-07 ENCOUNTER — Other Ambulatory Visit (HOSPITAL_COMMUNITY): Payer: Self-pay

## 2024-06-07 ENCOUNTER — Encounter: Payer: Self-pay | Admitting: Family Medicine

## 2024-06-07 DIAGNOSIS — G8929 Other chronic pain: Secondary | ICD-10-CM

## 2024-06-07 MED ORDER — CYCLOBENZAPRINE HCL 10 MG PO TABS
10.0000 mg | ORAL_TABLET | Freq: Three times a day (TID) | ORAL | 6 refills | Status: AC | PRN
  Filled 2024-06-07: qty 30, 10d supply, fill #0

## 2024-06-08 ENCOUNTER — Other Ambulatory Visit (HOSPITAL_COMMUNITY): Payer: Self-pay

## 2025-01-22 ENCOUNTER — Ambulatory Visit: Admitting: Family Medicine
